# Patient Record
Sex: Male | Born: 1950 | Race: White | Hispanic: No | Smoking: Current every day smoker
Health system: Southern US, Community
[De-identification: ages and names within clinical notes are randomized; demographics above are authoritative.]

## PROBLEM LIST (undated history)

## (undated) DIAGNOSIS — R413 Other amnesia: Secondary | ICD-10-CM

## (undated) DIAGNOSIS — F1010 Alcohol abuse, uncomplicated: Secondary | ICD-10-CM

## (undated) DIAGNOSIS — E785 Hyperlipidemia, unspecified: Secondary | ICD-10-CM

## (undated) DIAGNOSIS — I10 Essential (primary) hypertension: Secondary | ICD-10-CM

## (undated) DIAGNOSIS — G479 Sleep disorder, unspecified: Secondary | ICD-10-CM

## (undated) DIAGNOSIS — K219 Gastro-esophageal reflux disease without esophagitis: Secondary | ICD-10-CM

## (undated) DIAGNOSIS — R011 Cardiac murmur, unspecified: Secondary | ICD-10-CM

## (undated) DIAGNOSIS — R5383 Other fatigue: Secondary | ICD-10-CM

## (undated) DIAGNOSIS — M25562 Pain in left knee: Secondary | ICD-10-CM

## (undated) HISTORY — DX: Pain in left knee: M25.562

## (undated) HISTORY — PX: COLONOSCOPY: SHX174

## (undated) HISTORY — DX: Other amnesia: R41.3

## (undated) HISTORY — PX: KNEE SURGERY: SHX244

## (undated) HISTORY — PX: BACK SURGERY: SHX140

## (undated) HISTORY — DX: Other fatigue: R53.83

## (undated) HISTORY — DX: Sleep disorder, unspecified: G47.9

## (undated) HISTORY — DX: Cardiac murmur, unspecified: R01.1

## (undated) HISTORY — DX: Alcohol abuse, uncomplicated: F10.10

## (undated) HISTORY — DX: Hyperlipidemia, unspecified: E78.5

## (undated) HISTORY — DX: Essential (primary) hypertension: I10

## (undated) HISTORY — DX: Gastro-esophageal reflux disease without esophagitis: K21.9

---

## 2005-04-07 ENCOUNTER — Ambulatory Visit: Payer: Self-pay | Admitting: Gastroenterology

## 2016-09-01 ENCOUNTER — Telehealth: Payer: Self-pay | Admitting: *Deleted

## 2016-09-01 DIAGNOSIS — Z87891 Personal history of nicotine dependence: Secondary | ICD-10-CM

## 2016-09-01 NOTE — Telephone Encounter (Signed)
Received referral for initial lung cancer screening scan. Contacted patient and obtained smoking history,(current, 127.5 pack year) as well as answering questions related to screening process. Patient denies signs of lung cancer such as weight loss or hemoptysis. Patient denies comorbidity that would prevent curative treatment if lung cancer were found. Patient is scheduled for shared decision making visit and CT scan on 09/07/16.

## 2016-09-07 ENCOUNTER — Inpatient Hospital Stay: Payer: Medicare Other | Attending: Oncology | Admitting: Oncology

## 2016-09-07 ENCOUNTER — Ambulatory Visit
Admission: RE | Admit: 2016-09-07 | Discharge: 2016-09-07 | Disposition: A | Discharge status: Home / Self Care | Payer: Medicare Other | Source: Ambulatory Visit | Attending: Oncology | Admitting: Oncology

## 2016-09-07 ENCOUNTER — Encounter: Payer: Self-pay | Admitting: Oncology

## 2016-09-07 DIAGNOSIS — I2510 Atherosclerotic heart disease of native coronary artery without angina pectoris: Secondary | ICD-10-CM | POA: Insufficient documentation

## 2016-09-07 DIAGNOSIS — F17210 Nicotine dependence, cigarettes, uncomplicated: Secondary | ICD-10-CM

## 2016-09-07 DIAGNOSIS — Z87891 Personal history of nicotine dependence: Secondary | ICD-10-CM

## 2016-09-07 DIAGNOSIS — Z122 Encounter for screening for malignant neoplasm of respiratory organs: Secondary | ICD-10-CM

## 2016-09-07 DIAGNOSIS — J439 Emphysema, unspecified: Secondary | ICD-10-CM | POA: Insufficient documentation

## 2016-09-07 DIAGNOSIS — I700 Atherosclerosis of aorta: Secondary | ICD-10-CM | POA: Diagnosis not present

## 2016-09-07 DIAGNOSIS — Z720 Tobacco use: Secondary | ICD-10-CM | POA: Insufficient documentation

## 2016-09-07 NOTE — Progress Notes (Signed)
In accordance with CMS guidelines, patient has met eligibility criteria including age, absence of signs or symptoms of lung cancer.  Social History  Substance Use Topics  . Smoking status: Current Every Day Smoker    Packs/day: 2.50    Years: 51.00  . Smokeless tobacco: Not on file  . Alcohol use Not on file     A shared decision-making session was conducted prior to the performance of CT scan. This includes one or more decision aids, includes benefits and harms of screening, follow-up diagnostic testing, over-diagnosis, false positive rate, and total radiation exposure.  Counseling on the importance of adherence to annual lung cancer LDCT screening, impact of co-morbidities, and ability or willingness to undergo diagnosis and treatment is imperative for compliance of the program.  Counseling on the importance of continued smoking cessation for former smokers; the importance of smoking cessation for current smokers, and information about tobacco cessation interventions have been given to patient including [place] and [facility] quit [place] programs.  Written order for lung cancer screening with LDCT has been given to the patient and any and all questions have been answered to the best of my abilities.   Yearly follow up will be coordinated by Burgess Estelle, Thoracic Navigator.  Faythe Casa, NP 09/07/2016 2:34 PM

## 2016-09-10 ENCOUNTER — Encounter: Payer: Self-pay | Admitting: *Deleted

## 2016-12-15 ENCOUNTER — Encounter: Payer: Self-pay | Admitting: Internal Medicine

## 2016-12-15 ENCOUNTER — Ambulatory Visit (INDEPENDENT_AMBULATORY_CARE_PROVIDER_SITE_OTHER): Payer: Medicare Other | Admitting: Internal Medicine

## 2016-12-15 VITALS — BP 112/72 | HR 71 | Ht 68.0 in | Wt 171.8 lb

## 2016-12-15 DIAGNOSIS — I10 Essential (primary) hypertension: Secondary | ICD-10-CM

## 2016-12-15 DIAGNOSIS — F1010 Alcohol abuse, uncomplicated: Secondary | ICD-10-CM

## 2016-12-15 DIAGNOSIS — Z720 Tobacco use: Secondary | ICD-10-CM | POA: Diagnosis not present

## 2016-12-15 DIAGNOSIS — R0609 Other forms of dyspnea: Secondary | ICD-10-CM | POA: Diagnosis not present

## 2016-12-15 DIAGNOSIS — R0789 Other chest pain: Secondary | ICD-10-CM

## 2016-12-15 DIAGNOSIS — R160 Hepatomegaly, not elsewhere classified: Secondary | ICD-10-CM | POA: Diagnosis not present

## 2016-12-15 MED ORDER — NITROGLYCERIN 0.4 MG SL SUBL: 0.4000 mg | SUBLINGUAL_TABLET | SUBLINGUAL | 3 refills | Status: AC | PRN

## 2016-12-15 NOTE — Progress Notes (Signed)
New Outpatient Visit Date: 12/15/2016  Referring Provider: Herminio Commons, [place] [id number]  Chief Complaint: Chest pain  HPI:  Mr. Eckert is a 66 y.o. male who is being seen today for the evaluation of chest pain at the request of Dr. Gwynneth Aliment. He has a history of hypertension, hyperlipidemia, obesity, prediabetes, GERD, and alcohol/tobacco abuse. Mr. Reep reports substernal chest pain for more than a year, though it seems to be getting more frequent. He describes an "electricity" feeling under his chest that lasts up to 30 seconds. It is 9/10 in intensity and seems to occur sporadically. Sometimes he will have multiple episodes a day and other times he will go a week or two without any symptoms. There are no exacerbating or alleviating factors; chest pain is not clearly related to exertion. He notes associated dyspnea.  Mr. Knauer has chronic exertional dyspnea that has been gradually becoming worse over the last several years. He notes that even mild activity such as raking or digging a hole are becoming difficult for him, requiring him to stop during the activity to catch his breath. He notes occasional PND but denies orthopnea and leg edema. He denies palpitations other than feeling his heart beating fast when he exerts himself or "gets mad." He denies lightheadedness and syncope.  Mr. Raczkowski denies a history of cardiovascular disease, though he has undergone at least one stress test in the remote past. He believes the stress test was normal. Recent EKG by Dr. Gwynneth Aliment was abnormal with septal Q waves.  --------------------------------------------------------------------------------------------------  Cardiovascular History & Procedures: Cardiovascular Problems:  Atypical chest pain  Dyspnea on exertion  Risk Factors:  Hypertension, hyperlipidemia, male gender, age greater than 57, and tobacco use  Cath/PCI:  None  CV Surgery:  None  EP  Procedures and Devices:  None  Non-Invasive Evaluation(s):  Not available: Patient reports negative stress test greater than 10 years ago  --------------------------------------------------------------------------------------------------  Past Medical History:  Diagnosis Date  . Alcohol abuse   . Fatigue   . GERD (gastroesophageal reflux disease)   . Heart murmur   . Hyperlipidemia   . Hypertension   . Knee pain, left   . Memory impairment   . Sleeping difficulties     Past Surgical History:  Procedure Laterality Date  . BACK SURGERY    . COLONOSCOPY    . KNEE SURGERY Bilateral     Current Meds  Medication Sig  . aspirin 81 MG chewable tablet Chew 81 mg as needed by mouth.  Marland Kitchen atorvastatin (LIPITOR) 80 MG tablet Take 80 mg daily by mouth.  . hydrochlorothiazide (HYDRODIURIL) 25 MG tablet Take 25 mg daily by mouth.  Marland Kitchen lisinopril (PRINIVIL,ZESTRIL) 20 MG tablet Take 20 mg daily by mouth.  . meloxicam (MOBIC) 15 MG tablet Take 15 mg daily by mouth.  Marland Kitchen omeprazole (PRILOSEC) 20 MG capsule Take 20 mg daily by mouth.  . traZODone (DESYREL) 100 MG tablet Take 100 mg at bedtime by mouth.    Allergies: Patient has no allergy information on record.  Social History   Socioeconomic History  . Marital status: Single    Spouse name: Not on file  . Number of children: Not on file  . Years of education: Not on file  . Highest education level: Not on file  Social Needs  . Financial resource strain: Not on file  . Food insecurity - worry: Not on file  . Food insecurity - inability: Not on file  .  Transportation needs - medical: Not on file  . Transportation needs - non-medical: Not on file  Occupational History  . Not on file  Tobacco Use  . Smoking status: Current Every Day Smoker    Packs/day: 2.00    Years: 51.00    Pack years: 102.00  . Smokeless tobacco: Never Used  Substance and Sexual Activity  . Alcohol use: Yes    Alcohol/week: 42.0 oz    Types: 70 Cans of  beer per week    Frequency: Never  . Drug use: No  . Sexual activity: Not on file  Other Topics Concern  . Not on file  Social History Narrative  . Not on file    Family History  Problem Relation Age of Onset  . Other Mother        "blood clot in the heart"  . Diabetes Father   . Cancer Sister   . Cancer Brother     Review of Systems: Mr. Shirah notes leg cramps, predominantly at night. No leg pain with activity. Otherwise, a 12-system review of systems was performed and was negative except as noted in the HPI.  --------------------------------------------------------------------------------------------------  Physical Exam: BP 112/72 (BP Location: Right Arm, Patient Position: Sitting, Cuff Size: Normal)   Pulse 71   Ht 5\' 8"  (1.727 m)   Wt 171 lb 12 oz (77.9 kg)   BMI 26.11 kg/m   General:  Overweight man, seated comfortably in the exam room. He is accompanied by his wife. HEENT: No conjunctival pallor or scleral icterus. Moist mucous membranes. OP clear. Neck: Supple without lymphadenopathy, thyromegaly, JVD, or HJR. No carotid bruit. Lungs: Normal work of breathing. Mildly diminished breath sounds throughout without wheezes or crackles. Heart: Regular rate and rhythm without murmurs, rubs, or gallops. Non-displaced PMI. Abd: Bowel sounds present. Soft with mild right upper quadrant tenderness. Liver edge is palpable below the costal margin, suggestive of hepatomegaly. Ext: No lower extremity edema. Radial, PT, and DP pulses are 2+ bilaterally Skin: Warm and dry without rash. Neuro: CNIII-XII intact. Strength and fine-touch sensation intact in upper and lower extremities bilaterally. Psych: Normal mood and affect.  EKG:  Normal sinus rhythm without significant abnormalities. R-wave progression has improved from outside tracing on 10/20/16.  No results found for: WBC, HGB, HCT, MCV, PLT  No results found for: NA, K, CL, CO2, BUN, CREATININE, GLUCOSE, ALT  No results  found for: CHOL, HDL, LDLCALC, LDLDIRECT, TRIG, CHOLHDL  --------------------------------------------------------------------------------------------------  ASSESSMENT AND PLAN: Atypical chest pain Mr. Knisley has a long history of chest pain with atypical features in that it is brief, electrical in nature, and nonexertional. However, he also has long-standing exertional dyspnea that has progressed the last several years. He has multiple cardiac risk factors. His EKG is normal today. Previously noted septal Q waves are no longer present, which suggests that some may have been due to lead placement. Mr. Sweet is concerned about his ability to adequately exercise, given his dyspnea on exertion. We have therefore agreed to obtain a pharmacologic myocardial perfusion stress test. Based on the results of this, we will determine the need for cardiac catheterization and/or echocardiogram. In the meantime, I have provided Mr. Gales with a prescription for sublingual nitroglycerin to be used as needed for chest pain. He should continue with aspirin, atorvastatin, and resolved.  Dyspnea on exertion This is likely multifactorial, and driven predominantly by Mr. Dunlow's long history of smoking and possible underlying lung disease. I think it is reasonable to exclude significant CAD,  as outlined above. We will discuss utility of echocardiogram after completion of the stress test. Of note, Mr. Dettore appears euvolemic on exam today without murmurs.  Hepatomegaly Incidental finding on exam today. Given Mr. Carras is long history of heavy alcohol use, further imaging should be considered. I will defer this to Dr. Gwynneth Aliment.  Tobacco and alcohol abuse Mr. Novelo is a heavy drinker and smoker. I encouraged him to quit.  Follow-up: To be determined based on results of stress test.  Nelva Bush, MD 12/17/2016 8:35 AM

## 2016-12-15 NOTE — Patient Instructions (Addendum)
Medication Instructions:  Your physician recommends that you continue on your current medications as directed. Please refer to the Current Medication list given to you today. NITROGLYCERIN 0.4 mg tablet under the tongue every 5 minutes, for maximum of 3 times, AS NEEDED FOR CHEST PAIN. If chest pain not relieved after 3 doses, then call 911 and to go the emergency room.    Labwork: NONE  Testing/Procedures: Your physician has requested that you have a lexiscan myoview. For further information please visit [email or web address]. Please follow instruction sheet, as given.  [place]  Your caregiver has ordered a Stress Test with nuclear imaging. The purpose of this test is to evaluate the blood supply to your heart muscle. This procedure is referred to as a "Non-Invasive Stress Test." This is because other than having an IV started in your vein, nothing is inserted or "invades" your body. Cardiac stress tests are done to find areas of poor blood flow to the heart by determining the extent of coronary artery disease (CAD). Some patients exercise on a treadmill, which naturally increases the blood flow to your heart, while others who are  unable to walk on a treadmill due to physical limitations have a pharmacologic/chemical stress agent called Lexiscan . This medicine will mimic walking on a treadmill by temporarily increasing your coronary blood flow.   Please note: these test may take anywhere between 2-4 hours to complete  PLEASE REPORT TO [facility] AT THE FIRST DESK WILL DIRECT YOU WHERE TO GO  Date of Procedure:______11/20/18_________  Arrival Time for Procedure:_______08:45 AM____________  Instructions regarding medication:    _X_:  Hold other medications as follows:________HYDROCHLOROTHIAZIDE_____________    PLEASE NOTIFY THE OFFICE AT LEAST 24 HOURS IN ADVANCE IF YOU ARE UNABLE TO KEEP YOUR APPOINTMENT.  [phone] AND  PLEASE NOTIFY NUCLEAR  MEDICINE AT [facility] AT LEAST 24 HOURS IN ADVANCE IF YOU ARE UNABLE TO KEEP YOUR APPOINTMENT. [phone]  How to prepare for your Myoview test:  1. Do not eat or drink after midnight 2. No caffeine for 24 hours prior to test 3. No smoking 24 hours prior to test. 4. Your medication may be taken with water.  If your doctor stopped a medication because of this test, do not take that medication. 5. Ladies, please do not wear dresses.  Skirts or pants are appropriate. Please wear a short sleeve shirt. 6. No perfume, cologne or lotion. 7. Wear comfortable walking shoes. No heels!        Follow-Up: Your physician recommends that you schedule a follow-up appointment in: PENDING RESULTS OF STRESS TEST.    Cardiac Nuclear Scan A cardiac nuclear scan is a test that measures blood flow to the heart when a person is resting and when he or she is exercising. The test looks for problems such as:  Not enough blood reaching a portion of the heart.  The heart muscle not working normally.  You may need this test if:  You have heart disease.  You have had abnormal lab results.  You have had heart surgery or angioplasty.  You have chest pain.  You have shortness of breath.  In this test, a radioactive dye (tracer) is injected into your bloodstream. After the tracer has traveled to your heart, an imaging device is used to measure how much of the tracer is absorbed by or distributed to various areas of your heart. This procedure is usually done at a hospital and takes 2-4 hours. Tell a health care  provider about:  Any allergies you have.  All medicines you are taking, including vitamins, herbs, eye drops, creams, and over-the-counter medicines.  Any problems you or family members have had with the use of anesthetic medicines.  Any blood disorders you have.  Any surgeries you have had.  Any medical conditions you have.  Whether you are pregnant or may be pregnant. What are the  risks? Generally, this is a safe procedure. However, problems may occur, including:  Serious chest pain and heart attack. This is only a risk if the stress portion of the test is done.  Rapid heartbeat.  Sensation of warmth in your chest. This usually passes quickly.  What happens before the procedure?  Ask your health care provider about changing or stopping your regular medicines. This is especially important if you are taking diabetes medicines or blood thinners.  Remove your jewelry on the day of the procedure. What happens during the procedure?  An IV tube will be inserted into one of your veins.  Your health care provider will inject a small amount of radioactive tracer through the tube.  You will wait for 20-40 minutes while the tracer travels through your bloodstream.  Your heart activity will be monitored with an electrocardiogram (ECG).  You will lie down on an exam table.  Images of your heart will be taken for about 15-20 minutes.  You may be asked to exercise on a treadmill or stationary bike. While you exercise, your heart's activity will be monitored with an ECG, and your blood pressure will be checked. If you are unable to exercise, you may be given a medicine to increase blood flow to parts of your heart.  When blood flow to your heart has peaked, a tracer will again be injected through the IV tube.  After 20-40 minutes, you will get back on the exam table and have more images taken of your heart.  When the procedure is over, your IV tube will be removed. The procedure may vary among health care providers and hospitals. Depending on the type of tracer used, scans may need to be repeated 3-4 hours later. What happens after the procedure?  Unless your health care provider tells you otherwise, you may return to your normal schedule, including diet, activities, and medicines.  Unless your health care provider tells you otherwise, you may increase your fluid  intake. This will help flush the contrast dye from your body. Drink enough fluid to keep your urine clear or pale yellow.  It is up to you to get your test results. Ask your health care provider, or the department that is doing the test, when your results will be ready. Summary  A cardiac nuclear scan measures the blood flow to the heart when a person is resting and when he or she is exercising.  You may need this test if you are at risk for heart disease.  Tell your health care provider if you are pregnant.  Unless your health care provider tells you otherwise, increase your fluid intake. This will help flush the contrast dye from your body. Drink enough fluid to keep your urine clear or pale yellow. This information is not intended to replace advice given to you by your health care provider. Make sure you discuss any questions you have with your health care provider. Document Released: 02/13/2004 Document Revised: 01/21/2016 Document Reviewed: 12/27/2012 Elsevier Interactive Patient Education  2017 [employer].

## 2016-12-17 DIAGNOSIS — R160 Hepatomegaly, not elsewhere classified: Secondary | ICD-10-CM | POA: Insufficient documentation

## 2016-12-17 DIAGNOSIS — R0609 Other forms of dyspnea: Secondary | ICD-10-CM

## 2016-12-17 DIAGNOSIS — I10 Essential (primary) hypertension: Secondary | ICD-10-CM | POA: Insufficient documentation

## 2016-12-17 DIAGNOSIS — R0789 Other chest pain: Secondary | ICD-10-CM | POA: Insufficient documentation

## 2016-12-17 DIAGNOSIS — F1010 Alcohol abuse, uncomplicated: Secondary | ICD-10-CM | POA: Insufficient documentation

## 2016-12-21 ENCOUNTER — Ambulatory Visit
Admission: RE | Admit: 2016-12-21 | Discharge: 2016-12-21 | Disposition: A | Discharge status: Home / Self Care | Payer: Medicare Other | Source: Ambulatory Visit | Attending: Internal Medicine | Admitting: Internal Medicine

## 2016-12-21 DIAGNOSIS — R0789 Other chest pain: Secondary | ICD-10-CM | POA: Diagnosis not present

## 2016-12-21 LAB — NM MYOCAR MULTI W/SPECT W/WALL MOTION / EF
CHL CUP RESTING HR STRESS: 65 {beats}/min
CSEPPHR: 130 {beats}/min
LV sys vol: 29 mL
LVDIAVOL: 72 mL (ref 62–150)
Percent HR: 84 %
TID: 1.13

## 2016-12-21 MED ORDER — REGADENOSON 0.4 MG/5ML IV SOLN
0.4000 mg | Freq: Once | INTRAVENOUS | Status: AC
  Administered 2016-12-21: 0.4 mg via INTRAVENOUS

## 2016-12-21 MED ORDER — TECHNETIUM TC 99M TETROFOSMIN IV KIT
32.9200 | PACK | Freq: Once | INTRAVENOUS | Status: AC | PRN
  Administered 2016-12-21: 32.92 via INTRAVENOUS

## 2016-12-21 MED ORDER — TECHNETIUM TC 99M TETROFOSMIN IV KIT
10.0000 | PACK | Freq: Once | INTRAVENOUS | Status: AC | PRN
  Administered 2016-12-21: 13.92 via INTRAVENOUS

## 2016-12-22 ENCOUNTER — Telehealth: Payer: Self-pay | Admitting: *Deleted

## 2016-12-22 DIAGNOSIS — R0602 Shortness of breath: Secondary | ICD-10-CM

## 2016-12-22 NOTE — Telephone Encounter (Signed)
-----   Message from Nelva Bush, MD sent at 12/21/2016  8:55 PM EST ----- Please let Howard Lucas know that his stress test does not show any significant abnormalities. Given considerable shortness of breath, I recommend that Howard Lucas be seen by pulmonary and also have a transthoracic echocardiogram at his convenience. We will determine need for further cardiac follow-up after completion of the echo.

## 2016-12-22 NOTE — Telephone Encounter (Signed)
Thank you for the update.  I will defer to Mr. Cicero the timing of pulmonary referral and echocardiogram.  Nelva Bush, MD [place] Pager: [phone]

## 2016-12-22 NOTE — Telephone Encounter (Signed)
Results called to pt. Pt verbalized understanding of results and plan of care. However, patient does not want to schedule referral to pulmonology or echo until after the first of the year. He understood what echo and referral was for but still would like to wait. He verbalized understanding to call us when he is ready. Orders and referral placed in Epic with note to be done after the first of the year. Will route to Dr End to make him aware.

## 2017-02-03 ENCOUNTER — Encounter: Payer: Self-pay | Admitting: Internal Medicine

## 2017-06-29 ENCOUNTER — Other Ambulatory Visit: Payer: Self-pay | Admitting: Family Medicine

## 2017-06-29 DIAGNOSIS — F17200 Nicotine dependence, unspecified, uncomplicated: Secondary | ICD-10-CM

## 2017-06-29 DIAGNOSIS — Z136 Encounter for screening for cardiovascular disorders: Secondary | ICD-10-CM

## 2017-07-26 ENCOUNTER — Encounter (INDEPENDENT_AMBULATORY_CARE_PROVIDER_SITE_OTHER): Payer: Medicare Other | Admitting: Vascular Surgery

## 2017-09-01 ENCOUNTER — Telehealth: Payer: Self-pay | Admitting: Nurse Practitioner

## 2017-09-02 ENCOUNTER — Telehealth: Payer: Self-pay | Admitting: *Deleted

## 2017-09-02 DIAGNOSIS — Z122 Encounter for screening for malignant neoplasm of respiratory organs: Secondary | ICD-10-CM

## 2017-09-02 DIAGNOSIS — Z87891 Personal history of nicotine dependence: Secondary | ICD-10-CM

## 2017-09-02 NOTE — Telephone Encounter (Signed)
Notified patient that annual lung cancer screening low dose CT scan is due currently or will be in near future. Confirmed that patient is within the age range of 55-77, and asymptomatic, (no signs or symptoms of lung cancer). Patient denies illness that would prevent curative treatment for lung cancer if found. Verified smoking history, (current, 129.5 pack year). The shared decision making visit was done 09/07/16. Patient is agreeable for CT scan being scheduled.

## 2017-09-07 ENCOUNTER — Ambulatory Visit
Admission: RE | Admit: 2017-09-07 | Discharge: 2017-09-07 | Disposition: A | Discharge status: Home / Self Care | Payer: Medicare Other | Source: Ambulatory Visit | Attending: Nurse Practitioner | Admitting: Nurse Practitioner

## 2017-09-07 DIAGNOSIS — Z87891 Personal history of nicotine dependence: Secondary | ICD-10-CM | POA: Insufficient documentation

## 2017-09-07 DIAGNOSIS — J439 Emphysema, unspecified: Secondary | ICD-10-CM | POA: Insufficient documentation

## 2017-09-07 DIAGNOSIS — I700 Atherosclerosis of aorta: Secondary | ICD-10-CM | POA: Diagnosis not present

## 2017-09-07 DIAGNOSIS — Z122 Encounter for screening for malignant neoplasm of respiratory organs: Secondary | ICD-10-CM | POA: Diagnosis present

## 2017-09-09 ENCOUNTER — Encounter: Payer: Self-pay | Admitting: *Deleted

## 2017-11-22 ENCOUNTER — Encounter (INDEPENDENT_AMBULATORY_CARE_PROVIDER_SITE_OTHER): Payer: Medicare Other | Admitting: Vascular Surgery

## 2018-03-17 ENCOUNTER — Other Ambulatory Visit: Payer: Self-pay | Admitting: Family Medicine

## 2018-03-17 DIAGNOSIS — Z13820 Encounter for screening for osteoporosis: Secondary | ICD-10-CM

## 2018-03-24 ENCOUNTER — Other Ambulatory Visit (INDEPENDENT_AMBULATORY_CARE_PROVIDER_SITE_OTHER): Payer: Self-pay | Admitting: Vascular Surgery

## 2018-03-24 DIAGNOSIS — I739 Peripheral vascular disease, unspecified: Secondary | ICD-10-CM

## 2018-03-27 ENCOUNTER — Ambulatory Visit (INDEPENDENT_AMBULATORY_CARE_PROVIDER_SITE_OTHER): Payer: Medicare Other | Admitting: Vascular Surgery

## 2018-03-27 ENCOUNTER — Other Ambulatory Visit: Payer: Self-pay

## 2018-03-27 ENCOUNTER — Ambulatory Visit (INDEPENDENT_AMBULATORY_CARE_PROVIDER_SITE_OTHER): Payer: Medicare Other

## 2018-03-27 ENCOUNTER — Encounter (INDEPENDENT_AMBULATORY_CARE_PROVIDER_SITE_OTHER): Payer: Self-pay | Admitting: Vascular Surgery

## 2018-03-27 VITALS — BP 134/75 | HR 74 | Resp 16 | Ht 68.0 in | Wt 179.6 lb

## 2018-03-27 DIAGNOSIS — I10 Essential (primary) hypertension: Secondary | ICD-10-CM | POA: Diagnosis not present

## 2018-03-27 DIAGNOSIS — M47817 Spondylosis without myelopathy or radiculopathy, lumbosacral region: Secondary | ICD-10-CM | POA: Insufficient documentation

## 2018-03-27 DIAGNOSIS — M79606 Pain in leg, unspecified: Secondary | ICD-10-CM | POA: Insufficient documentation

## 2018-03-27 DIAGNOSIS — M79604 Pain in right leg: Secondary | ICD-10-CM | POA: Diagnosis not present

## 2018-03-27 DIAGNOSIS — I739 Peripheral vascular disease, unspecified: Secondary | ICD-10-CM

## 2018-03-27 DIAGNOSIS — I8312 Varicose veins of left lower extremity with inflammation: Secondary | ICD-10-CM

## 2018-03-27 DIAGNOSIS — I872 Venous insufficiency (chronic) (peripheral): Secondary | ICD-10-CM | POA: Diagnosis not present

## 2018-03-27 DIAGNOSIS — M79605 Pain in left leg: Secondary | ICD-10-CM | POA: Diagnosis not present

## 2018-03-27 DIAGNOSIS — I8311 Varicose veins of right lower extremity with inflammation: Secondary | ICD-10-CM | POA: Insufficient documentation

## 2018-03-27 NOTE — Progress Notes (Signed)
MRN : [id number]  Howard Lucas is a 68 y.o. (Oct 13, 1950) male who presents with chief complaint of leg pain.  History of Present Illness:    The patient denies changes in claudication symptoms or new rest pain symptoms.  No new ulcers or wounds of the foot.  The patient's blood pressure has been stable and relatively well controlled. The patient denies amaurosis fugax or recent TIA symptoms. There are no recent neurological changes noted. The patient denies history of DVT, PE or superficial thrombophlebitis. The patient denies recent episodes of angina or shortness of breath.   No outpatient medications have been marked as taking for the 03/27/18 encounter (Appointment) with Delana Meyer, Dolores Lory, MD.    Past Medical History:  Diagnosis Date  . Alcohol abuse   . Fatigue   . GERD (gastroesophageal reflux disease)   . Heart murmur   . Hyperlipidemia   . Hypertension   . Knee pain, left   . Memory impairment   . Sleeping difficulties     Past Surgical History:  Procedure Laterality Date  . BACK SURGERY    . COLONOSCOPY    . KNEE SURGERY Bilateral     Social History Social History   Tobacco Use  . Smoking status: Current Every Day Smoker    Packs/day: 2.00    Years: 51.00    Pack years: 102.00  . Smokeless tobacco: Never Used  Substance Use Topics  . Alcohol use: Yes    Alcohol/week: 70.0 standard drinks    Types: 70 Cans of beer per week    Frequency: Never  . Drug use: No    Family History Family History  Problem Relation Age of Onset  . Other Mother        "blood clot in the heart"  . Diabetes Father   . Cancer Sister   . Cancer Brother   No family history of bleeding/clotting disorders, porphyria or autoimmune disease   Not on File   REVIEW OF SYSTEMS (Negative unless checked)  Constitutional: [] Weight loss  [] Fever  [] Chills Cardiac: [] Chest pain   [] Chest pressure   [] Palpitations   [] Shortness of breath when laying flat   [] Shortness of breath  with exertion. Vascular:  [x] Pain in legs with walking   [x] Pain in legs at rest  [] History of DVT   [] Phlebitis   [] Swelling in legs   [] Varicose veins   [] Non-healing ulcers Pulmonary:   [] Uses home oxygen   [] Productive cough   [] Hemoptysis   [] Wheeze  [] COPD   [] Asthma Neurologic:  [] Dizziness   [] Seizures   [] History of stroke   [] History of TIA  [] Aphasia   [] Vissual changes   [] Weakness or numbness in arm   [x] Weakness or numbness in leg Musculoskeletal:   [] Joint swelling   [x] Joint pain   [x] Low back pain Hematologic:  [] Easy bruising  [] Easy bleeding   [] Hypercoagulable state   [] Anemic Gastrointestinal:  [] Diarrhea   [] Vomiting  [] Gastroesophageal reflux/heartburn   [] Difficulty swallowing. Genitourinary:  [] Chronic kidney disease   [] Difficult urination  [] Frequent urination   [] Blood in urine Skin:  [] Rashes   [] Ulcers  Psychological:  [] History of anxiety   []  History of major depression.  Physical Examination  There were no vitals filed for this visit. There is no height or weight on file to calculate BMI. Gen: WD/WN, NAD Head: [place]/AT, No temporalis wasting.  Ear/Nose/Throat: Hearing grossly intact, nares w/o erythema or drainage, poor dentition Eyes: PER, EOMI, sclera nonicteric.  Neck: Supple,  no masses.  No bruit or JVD.  Pulmonary:  Good air movement, clear to auscultation bilaterally, no use of accessory muscles.  Cardiac: RRR, normal S1, S2, no Murmurs. Vascular:   Vessel Right Left  Radial Palpable Palpable  PT Palpable Palpable  DP Palpable Palpable   Gastrointestinal: soft, non-distended. No guarding/no peritoneal signs.  Musculoskeletal: M/S 5/5 throughout.  No deformity or atrophy.  Neurologic: CN 2-12 intact. Pain and light touch intact in extremities.  Symmetrical.  Speech is fluent. Motor exam as listed above. Psychiatric: Judgment intact, Mood & affect appropriate for pt's clinical situation. Dermatologic: No rashes or ulcers noted.  No changes consistent  with cellulitis. Lymph : No Cervical lymphadenopathy, no lichenification or skin changes of chronic lymphedema.  CBC No results found for: WBC, HGB, HCT, MCV, PLT  BMET No results found for: NA, K, CL, CO2, GLUCOSE, BUN, CREATININE, CALCIUM, GFRNONAA, GFRAA CrCl cannot be calculated (No successful lab value found.).  COAG No results found for: INR, PROTIME  Radiology No results found.   Assessment/Plan 1. Pain in both lower extremities  Recommend:  The patient has evidence of atherosclerosis of the lower extremities with claudication.  The patient does not voice lifestyle limiting changes at this point in time.  Noninvasive studies do not suggest clinically significant change.  No invasive studies, angiography or surgery at this time The patient should continue walking and begin a more formal exercise program.  The patient should continue antiplatelet therapy and aggressive treatment of the lipid abnormalities  No changes in the patient's medications at this time  The patient should continue wearing graduated compression socks 10-15 mmHg strength to control the mild edema.   - VAS [place] ABI WITH/WO TBI; Future  2. DJD (degenerative joint disease), lumbosacral Recommend:  I do not find evidence of Vascular pathology that would explain the patient's symptoms  The patient has atypical pain symptoms for vascular disease  Noninvasive studies including venous ultrasound of the legs do not identify vascular problems  The patient should continue walking and begin a more formal exercise program. The patient should continue his antiplatelet therapy and aggressive treatment of the lipid abnormalities. The patient should begin wearing graduated compression socks 15-20 mmHg strength to control her mild edema.  Further work-up of her lower extremity pain is deferred to the primary service     3. Chronic venous insufficiency No surgery or intervention at this point in time.    I  have reviewed my discussion with the patient regarding venous insufficiency and secondary lymph edema and why it  causes symptoms. I have discussed with the patient the chronic skin changes that accompany these problems and the long term sequela such as ulceration and infection.  Patient will continue wearing graduated compression stockings class 1 (20-30 mmHg) on a daily basis a prescription was given to the patient to keep this updated. The patient will  put the stockings on first thing in the morning and removing them in the evening. The patient is instructed specifically not to sleep in the stockings.  In addition, behavioral modification including elevation during the day will be continued.  Diet and salt restriction was also discussed.  Previous duplex ultrasound of the lower extremities shows normal deep venous system, superficial reflux was not present.   Following the review of the ultrasound the patient will follow up in 12 months to reassess the degree of swelling and the control that graduated compression is offering.   The patient can be assessed for a  Lymph Pump at that time.  However, at this time the patient states they are satisfied with the control compression and elevation is yielding.    4. Varicose veins of both lower extremities with inflammation No surgery or intervention at this point in time.    I have reviewed my discussion with the patient regarding venous insufficiency and secondary lymph edema and why it  causes symptoms. I have discussed with the patient the chronic skin changes that accompany these problems and the long term sequela such as ulceration and infection.  Patient will continue wearing graduated compression stockings class 1 (20-30 mmHg) on a daily basis a prescription was given to the patient to keep this updated. The patient will  put the stockings on first thing in the morning and removing them in the evening. The patient is instructed specifically not to sleep  in the stockings.  In addition, behavioral modification including elevation during the day will be continued.  Diet and salt restriction was also discussed.  Previous duplex ultrasound of the lower extremities shows normal deep venous system, superficial reflux was not present.   Following the review of the ultrasound the patient will follow up in 12 months to reassess the degree of swelling and the control that graduated compression is offering.   The patient can be assessed for a Lymph Pump at that time.  However, at this time the patient states they are satisfied with the control compression and elevation is yielding.    5. Essential hypertension Continue antihypertensive medications as already ordered, these medications have been reviewed and there are no changes at this time.    Hortencia Pilar, MD  03/27/2018 8:36 AM

## 2018-04-03 ENCOUNTER — Other Ambulatory Visit: Payer: Medicare Other

## 2018-09-08 ENCOUNTER — Telehealth: Payer: Self-pay | Admitting: *Deleted

## 2018-09-08 NOTE — Telephone Encounter (Signed)
Patient has been notified that lung cancer screening CT scan is due currently or will be in near future. Confirmed that patient is within the appropriate age range, and asymptomatic, (no signs or symptoms of lung cancer). Patient denies illness that would prevent curative treatment for lung cancer if found. Verified smoking history (Current Smoker 2ppd). Patient is agreeable for CT scan being scheduled and has no preference to day or time.

## 2018-09-12 ENCOUNTER — Other Ambulatory Visit: Payer: Self-pay | Admitting: *Deleted

## 2018-09-12 DIAGNOSIS — Z87891 Personal history of nicotine dependence: Secondary | ICD-10-CM

## 2018-09-12 DIAGNOSIS — Z122 Encounter for screening for malignant neoplasm of respiratory organs: Secondary | ICD-10-CM

## 2018-09-14 ENCOUNTER — Other Ambulatory Visit: Payer: Self-pay

## 2018-09-14 ENCOUNTER — Ambulatory Visit
Admission: RE | Admit: 2018-09-14 | Discharge: 2018-09-14 | Disposition: A | Discharge status: Home / Self Care | Payer: Medicare Other | Source: Ambulatory Visit | Attending: Oncology | Admitting: Oncology

## 2018-09-14 DIAGNOSIS — Z122 Encounter for screening for malignant neoplasm of respiratory organs: Secondary | ICD-10-CM | POA: Diagnosis not present

## 2018-09-14 DIAGNOSIS — Z87891 Personal history of nicotine dependence: Secondary | ICD-10-CM | POA: Insufficient documentation

## 2018-09-15 ENCOUNTER — Encounter: Payer: Self-pay | Admitting: *Deleted

## 2019-03-29 ENCOUNTER — Encounter (INDEPENDENT_AMBULATORY_CARE_PROVIDER_SITE_OTHER): Payer: Medicare Other

## 2019-03-29 ENCOUNTER — Ambulatory Visit (INDEPENDENT_AMBULATORY_CARE_PROVIDER_SITE_OTHER): Payer: Medicare Other | Admitting: Vascular Surgery

## 2019-08-21 ENCOUNTER — Other Ambulatory Visit: Payer: Self-pay

## 2019-08-21 ENCOUNTER — Telehealth: Payer: Self-pay

## 2019-08-21 DIAGNOSIS — Z1211 Encounter for screening for malignant neoplasm of colon: Secondary | ICD-10-CM

## 2019-08-21 NOTE — Telephone Encounter (Signed)
Gastroenterology Pre-Procedure Review  Request Date: 09/14/19 Requesting Physician: Dr. Allen Norris  PATIENT REVIEW QUESTIONS: The patient responded to the following health history questions as indicated:    1. Are you having any GI issues? no 2. Do you have a personal history of Polyps? no 3. Do you have a family history of Colon Cancer or Polyps? no 4. Diabetes Mellitus? no 5. Joint replacements in the past 12 months?no 6. Major health problems in the past 3 months?no 7. Any artificial heart valves, MVP, or defibrillator?no    MEDICATIONS & ALLERGIES:    Patient reports the following regarding taking any anticoagulation/antiplatelet therapy:   Plavix, Coumadin, Eliquis, Xarelto, Lovenox, Pradaxa, Brilinta, or Effient? no Aspirin? yes (81 mg daily)  Patient confirms/reports the following medications:  Current Outpatient Medications  Medication Sig Dispense Refill   albuterol (PROVENTIL HFA;VENTOLIN HFA) 108 (90 Base) MCG/ACT inhaler Inhale into the lungs every 6 (six) hours as needed for wheezing or shortness of breath.     aspirin 81 MG chewable tablet Chew 81 mg as needed by mouth.     atorvastatin (LIPITOR) 80 MG tablet Take 80 mg daily by mouth.     B Complex Vitamins (B COMPLEX-B12 PO) Take by mouth daily.     budesonide-formoterol (SYMBICORT) 160-4.5 MCG/ACT inhaler Inhale 2 puffs into the lungs 2 (two) times daily.     hydrochlorothiazide (HYDRODIURIL) 25 MG tablet Take 25 mg daily by mouth.     lisinopril (PRINIVIL,ZESTRIL) 20 MG tablet Take 20 mg daily by mouth.     meloxicam (MOBIC) 15 MG tablet Take 15 mg daily by mouth.     nitroGLYCERIN (NITROSTAT) 0.4 MG SL tablet Place 1 tablet (0.4 mg total) every 5 (five) minutes as needed under the tongue for chest pain. 35 tablet 3   omeprazole (PRILOSEC) 20 MG capsule Take 20 mg daily by mouth.     PARoxetine (PAXIL) 20 MG tablet Take 20 mg by mouth daily.     sildenafil (VIAGRA) 100 MG tablet Take 100 mg by mouth daily as  needed for erectile dysfunction.     tadalafil (CIALIS) 10 MG tablet Take 10 mg by mouth daily as needed for erectile dysfunction.     traZODone (DESYREL) 100 MG tablet Take 100 mg at bedtime by mouth.     No current facility-administered medications for this visit.    Patient confirms/reports the following allergies:  No Known Allergies  No orders of the defined types were placed in this encounter.   AUTHORIZATION INFORMATION Primary Insurance: 1D#: Group #:  Secondary Insurance: 1D#: Group #:  SCHEDULE INFORMATION: Date: 09/14/19 Time: Location:[facility]

## 2019-08-22 ENCOUNTER — Telehealth: Payer: Medicare HMO

## 2019-08-22 ENCOUNTER — Other Ambulatory Visit: Payer: Self-pay

## 2019-08-22 DIAGNOSIS — Z1211 Encounter for screening for malignant neoplasm of colon: Secondary | ICD-10-CM

## 2019-09-06 ENCOUNTER — Telehealth: Payer: Self-pay

## 2019-09-06 NOTE — Telephone Encounter (Signed)
Patient notified that lung screening imaging is due currently or in the near future. Patient's preference is for 09/20/19 @ 9:30.  Patient is a current smoker, smoking 1 pack per day the past year.

## 2019-09-07 ENCOUNTER — Other Ambulatory Visit: Payer: Self-pay | Admitting: *Deleted

## 2019-09-07 DIAGNOSIS — Z122 Encounter for screening for malignant neoplasm of respiratory organs: Secondary | ICD-10-CM

## 2019-09-07 DIAGNOSIS — Z87891 Personal history of nicotine dependence: Secondary | ICD-10-CM

## 2019-09-08 ENCOUNTER — Other Ambulatory Visit: Payer: Self-pay | Admitting: Family Medicine

## 2019-09-08 DIAGNOSIS — R61 Generalized hyperhidrosis: Secondary | ICD-10-CM

## 2019-09-10 ENCOUNTER — Encounter: Payer: Self-pay | Admitting: Gastroenterology

## 2019-09-10 ENCOUNTER — Other Ambulatory Visit: Payer: Self-pay

## 2019-09-10 NOTE — Anesthesia Preprocedure Evaluation (Addendum)
Anesthesia Evaluation  Patient identified by MRN, date of birth, ID band Patient awake    Reviewed: Allergy & Precautions, NPO status , Patient's Chart, lab work & pertinent test results  History of Anesthesia Complications Negative for: history of anesthetic complications  Airway Mallampati: II  TM Distance: >3 FB Neck ROM: Full    Dental  (+) Poor Dentition   Pulmonary neg sleep apnea, neg COPD, Current SmokerPatient did not abstain from smoking.,    breath sounds clear to auscultation- rhonchi (-) wheezing      Cardiovascular hypertension, Pt. on medications + Peripheral Vascular Disease  (-) CAD, (-) Past MI, (-) Cardiac Stents and (-) CABG  Rhythm:Regular Rate:Normal - Systolic murmurs and - Diastolic murmurs  HLD   Neuro/Psych neg Seizures PSYCHIATRIC DISORDERS (EtOH abuse) negative neurological ROS     GI/Hepatic GERD  ,(+)     substance abuse  alcohol use,   Endo/Other  negative endocrine ROSneg diabetes  Renal/GU negative Renal ROS     Musculoskeletal  (+) Arthritis ,   Abdominal (+) - obese,   Peds  Hematology negative hematology ROS (+)   Anesthesia Other Findings Past Medical History: No date: Alcohol abuse No date: Fatigue No date: GERD (gastroesophageal reflux disease) No date: Heart murmur No date: Hyperlipidemia No date: Hypertension No date: Knee pain, left No date: Memory impairment No date: Sleeping difficulties   Reproductive/Obstetrics                            Anesthesia Physical Anesthesia Plan  ASA: III  Anesthesia Plan: General   Post-op Pain Management:    Induction: Intravenous  PONV Risk Score and Plan: 0 and Propofol infusion, TIVA and Treatment may vary due to age or medical condition  Airway Management Planned: Natural Airway and Nasal Cannula  Additional Equipment:   Intra-op Plan:   Post-operative Plan:   Informed Consent: I have  reviewed the patients History and Physical, chart, labs and discussed the procedure including the risks, benefits and alternatives for the proposed anesthesia with the patient or authorized representative who has indicated his/her understanding and acceptance.       Plan Discussed with: CRNA and Anesthesiologist  Anesthesia Plan Comments:        Anesthesia Quick Evaluation

## 2019-09-12 ENCOUNTER — Other Ambulatory Visit
Admission: RE | Admit: 2019-09-12 | Discharge: 2019-09-12 | Disposition: A | Discharge status: Home / Self Care | Payer: Medicare HMO | Source: Ambulatory Visit | Attending: Gastroenterology | Admitting: Gastroenterology

## 2019-09-12 ENCOUNTER — Other Ambulatory Visit: Payer: Self-pay

## 2019-09-12 DIAGNOSIS — Z01812 Encounter for preprocedural laboratory examination: Secondary | ICD-10-CM | POA: Insufficient documentation

## 2019-09-12 DIAGNOSIS — Z20822 Contact with and (suspected) exposure to covid-19: Secondary | ICD-10-CM | POA: Insufficient documentation

## 2019-09-13 LAB — SARS CORONAVIRUS 2 (TAT 6-24 HRS): SARS Coronavirus 2: NEGATIVE

## 2019-09-13 NOTE — Discharge Instructions (Signed)
General Anesthesia, Adult, Care After This sheet gives you information about how to care for yourself after your procedure. Your health care provider may also give you more specific instructions. If you have problems or questions, contact your health care provider. What can I expect after the procedure? After the procedure, the following side effects are common:  Pain or discomfort at the IV site.  Nausea.  Vomiting.  Sore throat.  Trouble concentrating.  Feeling cold or chills.  Weak or tired.  Sleepiness and fatigue.  Soreness and body aches. These side effects can affect parts of the body that were not involved in surgery. Follow these instructions at home:  For at least 24 hours after the procedure:  Have a responsible adult stay with you. It is important to have someone help care for you until you are awake and alert.  Rest as needed.  Do not: ? Participate in activities in which you could fall or become injured. ? Drive. ? Use heavy machinery. ? Drink alcohol. ? Take sleeping pills or medicines that cause drowsiness. ? Make important decisions or sign legal documents. ? Take care of children on your own. Eating and drinking  Follow any instructions from your health care provider about eating or drinking restrictions.  When you feel hungry, start by eating small amounts of foods that are soft and easy to digest (bland), such as toast. Gradually return to your regular diet.  Drink enough fluid to keep your urine pale yellow.  If you vomit, rehydrate by drinking water, juice, or clear broth. General instructions  If you have sleep apnea, surgery and certain medicines can increase your risk for breathing problems. Follow instructions from your health care provider about wearing your sleep device: ? Anytime you are sleeping, including during daytime naps. ? While taking prescription pain medicines, sleeping medicines, or medicines that make you drowsy.  Return to  your normal activities as told by your health care provider. Ask your health care provider what activities are safe for you.  Take over-the-counter and prescription medicines only as told by your health care provider.  If you smoke, do not smoke without supervision.  Keep all follow-up visits as told by your health care provider. This is important. Contact a health care provider if:  You have nausea or vomiting that does not get better with medicine.  You cannot eat or drink without vomiting.  You have pain that does not get better with medicine.  You are unable to pass urine.  You develop a skin rash.  You have a fever.  You have redness around your IV site that gets worse. Get help right away if:  You have difficulty breathing.  You have chest pain.  You have blood in your urine or stool, or you vomit blood. Summary  After the procedure, it is common to have a sore throat or nausea. It is also common to feel tired.  Have a responsible adult stay with you for the first 24 hours after general anesthesia. It is important to have someone help care for you until you are awake and alert.  When you feel hungry, start by eating small amounts of foods that are soft and easy to digest (bland), such as toast. Gradually return to your regular diet.  Drink enough fluid to keep your urine pale yellow.  Return to your normal activities as told by your health care provider. Ask your health care provider what activities are safe for you. This information is not   intended to replace advice given to you by your health care provider. Make sure you discuss any questions you have with your health care provider. Document Revised: 01/21/2017 Document Reviewed: 09/03/2016 Elsevier Patient Education  2020 [employer] Inc[employer]  

## 2019-09-14 ENCOUNTER — Encounter: Payer: Self-pay | Admitting: Anesthesiology

## 2019-09-14 ENCOUNTER — Encounter
Admission: RE | Disposition: A | Discharge status: Home / Self Care | Payer: Self-pay | Source: Home / Self Care | Attending: Gastroenterology

## 2019-09-14 ENCOUNTER — Other Ambulatory Visit: Payer: Self-pay

## 2019-09-14 ENCOUNTER — Ambulatory Visit
Admission: RE | Admit: 2019-09-14 | Discharge: 2019-09-14 | Disposition: A | Discharge status: Home / Self Care | Payer: Medicare HMO | Attending: Gastroenterology | Admitting: Gastroenterology

## 2019-09-14 DIAGNOSIS — D122 Benign neoplasm of ascending colon: Secondary | ICD-10-CM | POA: Insufficient documentation

## 2019-09-14 DIAGNOSIS — K219 Gastro-esophageal reflux disease without esophagitis: Secondary | ICD-10-CM | POA: Diagnosis not present

## 2019-09-14 DIAGNOSIS — I10 Essential (primary) hypertension: Secondary | ICD-10-CM | POA: Diagnosis not present

## 2019-09-14 DIAGNOSIS — Z791 Long term (current) use of non-steroidal anti-inflammatories (NSAID): Secondary | ICD-10-CM | POA: Insufficient documentation

## 2019-09-14 DIAGNOSIS — Z1211 Encounter for screening for malignant neoplasm of colon: Secondary | ICD-10-CM

## 2019-09-14 DIAGNOSIS — Z7982 Long term (current) use of aspirin: Secondary | ICD-10-CM | POA: Diagnosis not present

## 2019-09-14 DIAGNOSIS — Z7951 Long term (current) use of inhaled steroids: Secondary | ICD-10-CM | POA: Insufficient documentation

## 2019-09-14 DIAGNOSIS — E785 Hyperlipidemia, unspecified: Secondary | ICD-10-CM | POA: Insufficient documentation

## 2019-09-14 DIAGNOSIS — K635 Polyp of colon: Secondary | ICD-10-CM | POA: Diagnosis not present

## 2019-09-14 DIAGNOSIS — Z79899 Other long term (current) drug therapy: Secondary | ICD-10-CM | POA: Diagnosis not present

## 2019-09-14 DIAGNOSIS — F17210 Nicotine dependence, cigarettes, uncomplicated: Secondary | ICD-10-CM | POA: Diagnosis not present

## 2019-09-14 HISTORY — PX: COLONOSCOPY WITH PROPOFOL: SHX5780

## 2019-09-14 SURGERY — COLONOSCOPY WITH PROPOFOL
Anesthesia: General

## 2019-09-14 MED ORDER — SODIUM CHLORIDE 0.9 % IV SOLN
INTRAVENOUS | Status: DC
  Administered 2019-09-14: 20 mL/h via INTRAVENOUS

## 2019-09-14 MED ORDER — GLYCOPYRROLATE 0.2 MG/ML IJ SOLN
INTRAMUSCULAR | Status: DC | PRN
  Administered 2019-09-14: .2 mg via INTRAVENOUS

## 2019-09-14 MED ORDER — LIDOCAINE HCL (CARDIAC) PF 100 MG/5ML IV SOSY
PREFILLED_SYRINGE | INTRAVENOUS | Status: DC | PRN
  Administered 2019-09-14: 100 mg via INTRAVENOUS

## 2019-09-14 MED ORDER — PROPOFOL 10 MG/ML IV BOLUS
INTRAVENOUS | Status: DC | PRN
  Administered 2019-09-14: 60 mg via INTRAVENOUS
  Administered 2019-09-14 (×2): 10 mg via INTRAVENOUS

## 2019-09-14 MED ORDER — PROPOFOL 500 MG/50ML IV EMUL
INTRAVENOUS | Status: DC | PRN
  Administered 2019-09-14: 165 ug/kg/min via INTRAVENOUS

## 2019-09-14 NOTE — H&P (Signed)
Howard Lame, MD [address] [place], [place] [address] Phone: [phone] Fax : [phone]  Primary Care Physician:  Theotis Burrow, MD Primary Gastroenterologist:  Dr. Allen Norris  Pre-Procedure History & Physical: HPI:  Howard Lucas is a 69 y.o. male is here for a screening colonoscopy.   Past Medical History:  Diagnosis Date  . Alcohol abuse   . Fatigue   . GERD (gastroesophageal reflux disease)   . Heart murmur   . Hyperlipidemia   . Hypertension   . Knee pain, left   . Memory impairment   . Sleeping difficulties     Past Surgical History:  Procedure Laterality Date  . BACK SURGERY    . COLONOSCOPY    . KNEE SURGERY Bilateral     Prior to Admission medications   Medication Sig Start Date End Date Taking? Authorizing Provider  albuterol (PROVENTIL HFA;VENTOLIN HFA) 108 (90 Base) MCG/ACT inhaler Inhale into the lungs every 6 (six) hours as needed for wheezing or shortness of breath.   Yes [provider]  aspirin 81 MG chewable tablet Chew 81 mg as needed by mouth.   Yes [provider]  atorvastatin (LIPITOR) 80 MG tablet Take 80 mg daily by mouth.   Yes [provider]  B Complex Vitamins (B COMPLEX-B12 PO) Take by mouth daily.    Yes [provider]  budesonide-formoterol (SYMBICORT) 160-4.5 MCG/ACT inhaler Inhale 2 puffs into the lungs 2 (two) times daily.   Yes [provider]  hydrochlorothiazide (HYDRODIURIL) 25 MG tablet Take 25 mg daily by mouth.   Yes [provider]  lisinopril (PRINIVIL,ZESTRIL) 20 MG tablet Take 20 mg daily by mouth.   Yes [provider]  meloxicam (MOBIC) 15 MG tablet Take 15 mg daily by mouth.   Yes [provider]  omeprazole (PRILOSEC) 20 MG capsule Take 20 mg daily by mouth.   Yes [provider]  PARoxetine (PAXIL) 20 MG tablet Take 20 mg by mouth daily.    Yes [provider]  sildenafil (VIAGRA) 100 MG tablet Take 100  mg by mouth daily as needed for erectile dysfunction.    Yes [provider]  tadalafil (CIALIS) 10 MG tablet Take 10 mg by mouth daily as needed for erectile dysfunction.    Yes [provider]  traZODone (DESYREL) 100 MG tablet Take 100 mg at bedtime by mouth.   Yes [provider]  nitroGLYCERIN (NITROSTAT) 0.4 MG SL tablet Place 1 tablet (0.4 mg total) every 5 (five) minutes as needed under the tongue for chest pain. 12/15/16 03/15/17  Nelva Bush, MD    Allergies as of 08/21/2019  . (No Known Allergies)    Family History  Problem Relation Age of Onset  . Other Mother        "blood clot in the heart"  . Diabetes Father   . Cancer Sister   . Cancer Brother     Social History   Socioeconomic History  . Marital status: Single    Spouse name: Not on file  . Number of children: Not on file  . Years of education: Not on file  . Highest education level: Not on file  Occupational History  . Not on file  Tobacco Use  . Smoking status: Current Every Day Smoker    Packs/day: 2.00    Years: 51.00    Pack years: 102.00  . Smokeless tobacco: Never Used  Vaping Use  . Vaping Use: Never used  Substance and Sexual Activity  . Alcohol use: Yes    Alcohol/week: 70.0 standard drinks    Types: 70 Cans of beer per week    Comment: 8/9 - pt reports he has cut back to about 6 beers on Tues and Fri)  . Drug use: No  . Sexual activity: Not on file  Other Topics Concern  . Not on file  Social History Narrative  . Not on file   Social Determinants of Health   Financial Resource Strain:   . Difficulty of Paying Living Expenses:   Food Insecurity:   . Worried About Charity fundraiser in the Last Year:   . Arboriculturist in the Last Year:   Transportation Needs:   . Film/video editor (Medical):   Marland Kitchen Lack of Transportation (Non-Medical):   Physical Activity:   . Days of Exercise per Week:   . Minutes of Exercise per Session:   Stress:   .  Feeling of Stress :   Social Connections:   . Frequency of Communication with Friends and Family:   . Frequency of Social Gatherings with Friends and Family:   . Attends Religious Services:   . Active Member of Clubs or Organizations:   . Attends Archivist Meetings:   Marland Kitchen Marital Status:   Intimate Partner Violence:   . Fear of Current or Ex-Partner:   . Emotionally Abused:   Marland Kitchen Physically Abused:   . Sexually Abused:     Review of Systems: See HPI, otherwise negative ROS  Physical Exam: BP 118/71   Pulse 86   Temp (!) 96.8 F (36 C) (Temporal)   Resp 20   Ht 5\' 8"  (1.727 m)   Wt 79.4 kg   SpO2 98%   BMI 26.61 kg/m  General:   Alert,  pleasant and cooperative in NAD Head:  Normocephalic and atraumatic. Neck:  Supple; no masses or thyromegaly. Lungs:  Clear throughout to auscultation.    Heart:  Regular rate and rhythm. Abdomen:  Soft, nontender and nondistended. Normal bowel sounds, without guarding, and without rebound.   Neurologic:  Alert and  oriented x4;  grossly normal neurologically.  Impression/Plan: Howard Lucas is now here to undergo a screening colonoscopy.  Risks, benefits, and alternatives regarding colonoscopy have been reviewed with the patient.  Questions have been answered.  All parties agreeable.

## 2019-09-14 NOTE — Anesthesia Procedure Notes (Signed)
Procedure Name: General with mask airway Performed by: Fletcher-Harrison, Elayah Klooster, CRNA Pre-anesthesia Checklist: Patient identified, Emergency Drugs available, Suction available and Patient being monitored Patient Re-evaluated:Patient Re-evaluated prior to induction Oxygen Delivery Method: Simple face mask Induction Type: IV induction Placement Confirmation: positive ETCO2 and CO2 detector Dental Injury: Teeth and Oropharynx as per pre-operative assessment        

## 2019-09-14 NOTE — Op Note (Signed)
[facility] Gastroenterology Patient Name: Howard Lucas Procedure Date: 09/14/2019 11:03 AM MRN: [id number] Account #: [id number] Date of Birth: [birth year] Admit Type: Outpatient Age: 69 Room: [facility] ENDO ROOM 2 Gender: Male Note Status: Finalized Procedure:             Colonoscopy Indications:           Screening for colorectal malignant neoplasm Providers:             Lucilla Lame MD, MD Referring MD:          Elyse Jarvis Revelo (Referring MD) Medicines:             Propofol per Anesthesia Complications:         No immediate complications. Procedure:             Pre-Anesthesia Assessment:                        - Prior to the procedure, a History and Physical was                         performed, and patient medications and allergies were                         reviewed. The patient's tolerance of previous                         anesthesia was also reviewed. The risks and benefits                         of the procedure and the sedation options and risks                         were discussed with the patient. All questions were                         answered, and informed consent was obtained. Prior                         Anticoagulants: The patient has taken no previous                         anticoagulant or antiplatelet agents. ASA Grade                         Assessment: II - A patient with mild systemic disease.                         After reviewing the risks and benefits, the patient                         was deemed in satisfactory condition to undergo the                         procedure.                        After obtaining informed consent, the colonoscope was  passed under direct vision. Throughout the procedure,                         the patient's blood pressure, pulse, and oxygen                         saturations were monitored continuously. The                         Colonoscope was introduced through the  anus and                         advanced to the the cecum, identified by appendiceal                         orifice and ileocecal valve. The colonoscopy was                         performed without difficulty. The patient tolerated                         the procedure well. The quality of the bowel                         preparation was excellent. Findings:      The perianal and digital rectal examinations were normal.      Three sessile polyps were found in the rectum. The polyps were 3 to 4 mm       in size. These polyps were removed with a cold snare. Resection and       retrieval were complete.      A 5 mm polyp was found in the sigmoid colon. The polyp was sessile. The       polyp was removed with a cold snare. Resection and retrieval were       complete.      Two sessile polyps were found in the rectum. The polyps were 4 to 5 mm       in size. These polyps were removed with a cold snare. Resection and       retrieval were complete. Impression:            - Three 3 to 4 mm polyps in the rectum, removed with a                         cold snare. Resected and retrieved.                        - One 5 mm polyp in the sigmoid colon, removed with a                         cold snare. Resected and retrieved.                        - Two 4 to 5 mm polyps in the rectum, removed with a                         cold snare. Resected and retrieved. Recommendation:        - Discharge patient to home.                        -  Resume previous diet.                        - Continue present medications.                        - Await pathology results.                        - Repeat colonoscopy in 5 years if polyp adenoma and                         10 years if hyperplastic Procedure Code(s):     --- Professional ---                        [phone], Colonoscopy, flexible; with removal of                         tumor(s), polyp(s), or other lesion(s) by snare                          technique Diagnosis Code(s):     --- Professional ---                        Z12.11, Encounter for screening for malignant neoplasm                         of colon                        K62.1, Rectal polyp CPT copyright 2019 [employer] Association. All rights reserved. The codes documented in this report are preliminary and upon coder review may  be revised to meet current compliance requirements. Lucilla Lame MD, MD 09/14/2019 11:32:46 AM This report has been signed electronically. Number of Addenda: 0 Note Initiated On: 09/14/2019 11:03 AM Scope Withdrawal Time: 0 hours 11 minutes 52 seconds  Total Procedure Duration: 0 hours 18 minutes 6 seconds  Estimated Blood Loss:  Estimated blood loss: none.      [facility]

## 2019-09-14 NOTE — Transfer of Care (Signed)
Immediate Anesthesia Transfer of Care Note  Patient: Howard Lucas  Procedure(s) Performed: COLONOSCOPY WITH PROPOFOL (N/A )  Patient Location: Endoscopy Unit  Anesthesia Type:General  Level of Consciousness: drowsy and responds to stimulation  Airway & Oxygen Therapy: Patient Spontanous Breathing and Patient connected to face mask oxygen  Post-op Assessment: Report given to RN and Post -op Vital signs reviewed and stable  Post vital signs: Reviewed and stable  Last Vitals:  Vitals Value Taken Time  BP    Temp    Pulse 96 09/14/19 1135  Resp 13 09/14/19 1135  SpO2 99 % 09/14/19 1135  Vitals shown include unvalidated device data.  Last Pain:  Vitals:   09/14/19 1048  TempSrc: Temporal  PainSc: 0-No pain         Complications: No complications documented.

## 2019-09-14 NOTE — Anesthesia Postprocedure Evaluation (Signed)
Anesthesia Post Note  Patient: Howard Lucas  Procedure(s) Performed: COLONOSCOPY WITH PROPOFOL (N/A )  Patient location during evaluation: Endoscopy Anesthesia Type: General Level of consciousness: awake and alert and oriented Pain management: pain level controlled Vital Signs Assessment: post-procedure vital signs reviewed and stable Respiratory status: spontaneous breathing, nonlabored ventilation and respiratory function stable Cardiovascular status: blood pressure returned to baseline and stable Postop Assessment: no signs of nausea or vomiting Anesthetic complications: no   No complications documented.   Last Vitals:  Vitals:   09/14/19 1135 09/14/19 1145  BP:    Pulse:    Resp:  20  Temp: 36.8 C   SpO2:      Last Pain:  Vitals:   09/14/19 1205  TempSrc:   PainSc: 0-No pain                 Rusty Villella

## 2019-09-17 ENCOUNTER — Encounter: Payer: Self-pay | Admitting: Gastroenterology

## 2019-09-17 LAB — SURGICAL PATHOLOGY

## 2019-09-20 ENCOUNTER — Other Ambulatory Visit: Payer: Self-pay

## 2019-09-20 ENCOUNTER — Ambulatory Visit
Admission: RE | Admit: 2019-09-20 | Discharge: 2019-09-20 | Disposition: A | Discharge status: Home / Self Care | Payer: Medicare HMO | Source: Ambulatory Visit | Attending: Oncology | Admitting: Oncology

## 2019-09-20 DIAGNOSIS — Z87891 Personal history of nicotine dependence: Secondary | ICD-10-CM | POA: Insufficient documentation

## 2019-09-20 DIAGNOSIS — Z122 Encounter for screening for malignant neoplasm of respiratory organs: Secondary | ICD-10-CM | POA: Insufficient documentation

## 2019-09-25 ENCOUNTER — Encounter: Payer: Self-pay | Admitting: *Deleted

## 2019-09-28 ENCOUNTER — Ambulatory Visit: Admission: RE | Admit: 2019-09-28 | Payer: Medicare HMO | Source: Ambulatory Visit

## 2019-10-03 ENCOUNTER — Other Ambulatory Visit: Payer: Self-pay

## 2019-10-03 ENCOUNTER — Other Ambulatory Visit
Admission: RE | Admit: 2019-10-03 | Discharge: 2019-10-03 | Disposition: A | Discharge status: Home / Self Care | Payer: Medicare HMO | Source: Ambulatory Visit | Attending: Family Medicine | Admitting: Family Medicine

## 2019-10-03 ENCOUNTER — Ambulatory Visit
Admission: RE | Admit: 2019-10-03 | Discharge: 2019-10-03 | Disposition: A | Discharge status: Home / Self Care | Payer: Medicare HMO | Source: Ambulatory Visit | Attending: Family Medicine | Admitting: Family Medicine

## 2019-10-03 ENCOUNTER — Other Ambulatory Visit: Payer: Self-pay | Admitting: Family Medicine

## 2019-10-03 DIAGNOSIS — R1013 Epigastric pain: Secondary | ICD-10-CM | POA: Insufficient documentation

## 2019-10-03 DIAGNOSIS — R61 Generalized hyperhidrosis: Secondary | ICD-10-CM

## 2019-10-03 DIAGNOSIS — I700 Atherosclerosis of aorta: Secondary | ICD-10-CM | POA: Diagnosis not present

## 2019-10-03 LAB — CREATININE, SERUM
Creatinine, Ser: 1.74 mg/dL — ABNORMAL HIGH (ref 0.61–1.24)
GFR calc Af Amer: 45 mL/min — ABNORMAL LOW (ref >60)
GFR calc non Af Amer: 39 mL/min — ABNORMAL LOW (ref >60)

## 2019-10-03 MED ORDER — IOHEXOL 300 MG/ML  SOLN: 100.0000 mL | Freq: Once | INTRAMUSCULAR | Status: DC | PRN

## 2019-11-13 ENCOUNTER — Other Ambulatory Visit: Payer: Self-pay | Admitting: Family Medicine

## 2019-11-13 DIAGNOSIS — M25552 Pain in left hip: Secondary | ICD-10-CM

## 2020-05-03 ENCOUNTER — Ambulatory Visit
Admission: EM | Admit: 2020-05-03 | Discharge: 2020-05-03 | Disposition: A | Discharge status: Home / Self Care | Payer: Medicare HMO | Attending: Sports Medicine | Admitting: Sports Medicine

## 2020-05-03 ENCOUNTER — Encounter: Payer: Self-pay | Admitting: Emergency Medicine

## 2020-05-03 ENCOUNTER — Other Ambulatory Visit: Payer: Self-pay

## 2020-05-03 DIAGNOSIS — B029 Zoster without complications: Secondary | ICD-10-CM | POA: Diagnosis not present

## 2020-05-03 DIAGNOSIS — R21 Rash and other nonspecific skin eruption: Secondary | ICD-10-CM | POA: Diagnosis not present

## 2020-05-03 DIAGNOSIS — L299 Pruritus, unspecified: Secondary | ICD-10-CM | POA: Diagnosis not present

## 2020-05-03 MED ORDER — VALACYCLOVIR HCL 1 G PO TABS
1000.0000 mg | ORAL_TABLET | Freq: Three times a day (TID) | ORAL | 0 refills | Status: DC
Start: 2020-05-03 — End: 2021-05-15

## 2020-05-03 MED ORDER — PREDNISONE 10 MG (21) PO TBPK
ORAL_TABLET | Freq: Every day | ORAL | 0 refills | Status: DC
Start: 2020-05-03 — End: 2021-02-07

## 2020-05-03 NOTE — ED Triage Notes (Signed)
Patient c/o red tinging and burning rash on the left side that started 2 weeks ago.  Patient denies fevers.

## 2020-05-03 NOTE — Discharge Instructions (Addendum)
Your rash is consistent with herpes zoster which is this shingles reactivation of chickenpox. I prescribed a medication called Valtrex for this.  I have also given you some prednisone to help with calming down the inflammation. Please do not take any Motrin, Advil, Naprosyn, Aleve, or ibuprofen or aspirin products while taking the prednisone.  You can take Tylenol. I was going to give you some tramadol, however given some of the medications you are on there is a contraindication for that so I decided to give you the prednisone instead. Please do not scratch the rash. You can use over-the-counter topical medicine such as Benadryl or hydrocortisone 1% to help with the itch.  You can also use oral Benadryl at night to help with the itching assist with sleeping. I want you to contact your primary care provider on Monday and let them know so that they can follow along. As we discussed reasons to go to the ER and the red flag signs and symptoms that we discussed in detail.  Of note if it involves the eye then you need to call 911 and go to the ER.  I hope you get to feeling better, Dr. Drema Dallas

## 2020-05-03 NOTE — ED Provider Notes (Signed)
[facility]    CSN: [id number] Arrival date & time: 05/03/20  1037      History   Chief Complaint Chief Complaint  Patient presents with  . Rash    HPI Howard Lucas is a 70 y.o. male.   Patient is a pleasant 70 year old male who presents with his daughter for evaluation of a rash on the left side of the posterior aspect of his head and onto the ground.  He has had symptoms now for a couple of weeks.  He has a blistery rash.  There is no significant discharge.  A little bit of neck stiffness is noted.  He did have chickenpox as a child.  He has not received the shingles booster.  He is under a lot of stress lately.  No new exposures or new foods.  The rash burns along the dermatome.  He denies any vision changes.  No significant headaches diaphoresis or jaw pain.  No chest pain or shortness of breath.  No red flag signs or symptoms elicited on history.  He normally sees [facility] but could not get in today.  He works as a Administrator.  According to his daughter to the pain can be quite severe at times.  Presents today for initial evaluation.     Past Medical History:  Diagnosis Date  . Alcohol abuse   . Fatigue   . GERD (gastroesophageal reflux disease)   . Heart murmur   . Hyperlipidemia   . Hypertension   . Knee pain, left   . Memory impairment   . Sleeping difficulties     Patient Active Problem List   Diagnosis Date Noted  . Encounter for screening colonoscopy   . Polyp of ascending colon   . Leg pain 03/27/2018  . DJD (degenerative joint disease), lumbosacral 03/27/2018  . Chronic venous insufficiency 03/27/2018  . Varicose veins of both lower extremities with inflammation 03/27/2018  . Atypical chest pain 12/17/2016  . Dyspnea on exertion 12/17/2016  . Essential hypertension 12/17/2016  . Hepatomegaly 12/17/2016  . Alcohol abuse 12/17/2016  . Tobacco abuse 09/07/2016    Past Surgical History:  Procedure Laterality Date  .  BACK SURGERY    . COLONOSCOPY    . COLONOSCOPY WITH PROPOFOL N/A 09/14/2019   Procedure: COLONOSCOPY WITH PROPOFOL;  Surgeon: Lucilla Lame, MD;  Location: [facility] ENDOSCOPY;  Service: Endoscopy;  Laterality: N/A;  . KNEE SURGERY Bilateral        Home Medications    Prior to Admission medications   Medication Sig Start Date End Date Taking? Authorizing Provider  albuterol (PROVENTIL HFA;VENTOLIN HFA) 108 (90 Base) MCG/ACT inhaler Inhale into the lungs every 6 (six) hours as needed for wheezing or shortness of breath.   Yes [provider]  aspirin 81 MG chewable tablet Chew 81 mg as needed by mouth.   Yes [provider]  atorvastatin (LIPITOR) 80 MG tablet Take 80 mg daily by mouth.   Yes [provider]  B Complex Vitamins (B COMPLEX-B12 PO) Take by mouth daily.    Yes [provider]  budesonide-formoterol (SYMBICORT) 160-4.5 MCG/ACT inhaler Inhale 2 puffs into the lungs 2 (two) times daily.   Yes [provider]  hydrochlorothiazide (HYDRODIURIL) 25 MG tablet Take 25 mg daily by mouth.   Yes [provider]  lisinopril (PRINIVIL,ZESTRIL) 20 MG tablet Take 20 mg daily by mouth.   Yes [provider]  meloxicam (MOBIC) 15 MG tablet Take 15  mg daily by mouth.   Yes [provider]  omeprazole (PRILOSEC) 20 MG capsule Take 20 mg daily by mouth.   Yes [provider]  PARoxetine (PAXIL) 20 MG tablet Take 20 mg by mouth daily.    Yes [provider]  predniSONE (STERAPRED UNI-PAK 21 TAB) 10 MG (21) TBPK tablet Take by mouth daily. Take 6 tabs by mouth daily  for 2 days, then 5 tabs for 2 days, then 4 tabs for 2 days, then 3 tabs for 2 days, 2 tabs for 2 days, then 1 tab by mouth daily for 2 days 05/03/20  Yes Verda Cumins, MD  sildenafil (VIAGRA) 100 MG tablet Take 100 mg by mouth daily as needed for erectile dysfunction.    Yes [provider]  valACYclovir (VALTREX) 1000 MG tablet Take 1 tablet  (1,000 mg total) by mouth 3 (three) times daily. 05/03/20  Yes Verda Cumins, MD  nitroGLYCERIN (NITROSTAT) 0.4 MG SL tablet Place 1 tablet (0.4 mg total) every 5 (five) minutes as needed under the tongue for chest pain. 12/15/16 03/15/17  End, Harrell Gave, MD  tadalafil (CIALIS) 10 MG tablet Take 10 mg by mouth daily as needed for erectile dysfunction.     [provider]  traZODone (DESYREL) 100 MG tablet Take 100 mg at bedtime by mouth.    [provider]    Family History Family History  Problem Relation Age of Onset  . Other Mother        "blood clot in the heart"  . Diabetes Father   . Cancer Sister   . Cancer Brother     Social History Social History   Tobacco Use  . Smoking status: Current Every Day Smoker    Packs/day: 2.00    Years: 51.00    Pack years: 102.00  . Smokeless tobacco: Never Used  Vaping Use  . Vaping Use: Never used  Substance Use Topics  . Alcohol use: Yes    Alcohol/week: 70.0 standard drinks    Types: 70 Cans of beer per week    Comment: 8/9 - pt reports he has cut back to about 6 beers on Tues and Fri)  . Drug use: No     Allergies   Patient has no known allergies.   Review of Systems Review of Systems  Constitutional: Negative.  Negative for activity change, appetite change, chills, diaphoresis, fatigue and fever.  HENT: Negative.  Negative for congestion, sinus pain and sore throat.   Eyes: Negative for pain.  Respiratory: Negative.  Negative for cough, chest tightness, shortness of breath, wheezing and stridor.   Cardiovascular: Negative.  Negative for chest pain and palpitations.  Gastrointestinal: Negative.  Negative for abdominal pain.  Genitourinary: Negative.   Musculoskeletal: Negative.  Negative for myalgias.  Skin: Negative.  Negative for color change, pallor, rash and wound.  Neurological: Positive for numbness and headaches. Negative for dizziness, tremors, seizures, syncope, facial asymmetry, speech  difficulty, weakness and light-headedness.  All other systems reviewed and are negative.    Physical Exam Triage Vital Signs ED Triage Vitals  Enc Vitals Group     BP 05/03/20 1057 (!) 146/83     Pulse Rate 05/03/20 1057 79     Resp 05/03/20 1057 16     Temp 05/03/20 1057 97.8 F (36.6 C)     Temp Source 05/03/20 1057 Oral     SpO2 05/03/20 1057 96 %     Weight 05/03/20 1052 168 lb (76.2 kg)  Height 05/03/20 1052 5\' 8"  (1.727 m)     Head Circumference --      Peak Flow --      Pain Score 05/03/20 1050 7     Pain Loc --      Pain Edu? --      Excl. in [place]? --    No data found.  Updated Vital Signs BP (!) 146/83 (BP Location: Left Arm)   Pulse 79   Temp 97.8 F (36.6 C) (Oral)   Resp 16   Ht 5\' 8"  (1.727 m)   Wt 76.2 kg   SpO2 96%   BMI 25.54 kg/m   Visual Acuity Right Eye Distance:   Left Eye Distance:   Bilateral Distance:    Right Eye Near:   Left Eye Near:    Bilateral Near:     Physical Exam Vitals and nursing note reviewed.  Constitutional:      General: He is not in acute distress.    Appearance: Normal appearance. He is not ill-appearing, toxic-appearing or diaphoretic.  HENT:     Nose: No congestion or rhinorrhea.     Mouth/Throat:     Mouth: Mucous membranes are moist.     Pharynx: No oropharyngeal exudate or posterior oropharyngeal erythema.  Eyes:     General: No visual field deficit. Cardiovascular:     Rate and Rhythm: Normal rate and regular rhythm.     Pulses: Normal pulses.     Heart sounds: Normal heart sounds. No murmur heard. No friction rub. No gallop.   Pulmonary:     Effort: Pulmonary effort is normal. No respiratory distress.     Breath sounds: Normal breath sounds. No stridor. No wheezing, rhonchi or rales.  Musculoskeletal:     Cervical back: Normal range of motion and neck supple. No rigidity or tenderness.  Lymphadenopathy:     Cervical: No cervical adenopathy.  Skin:    General: Skin is warm.     Capillary Refill:  Capillary refill takes less than 2 seconds.     Coloration: Skin is not jaundiced.     Findings: Lesion and rash present. No abrasion, bruising, ecchymosis, erythema or signs of injury. Rash is crusting and vesicular.  Neurological:     General: No focal deficit present.     Mental Status: He is alert and oriented to person, place, and time.     GCS: GCS eye subscore is 4. GCS verbal subscore is 5. GCS motor subscore is 6.     Cranial Nerves: No cranial nerve deficit, dysarthria or facial asymmetry.     Comments: Vesicular rash on the top of the left posterior aspect of the head      UC Treatments / Results  Labs (all labs ordered are listed, but only abnormal results are displayed) Labs Reviewed - No data to display  EKG   Radiology No results found.  Procedures Procedures (including critical care time)  Medications Ordered in UC Medications - No data to display  Initial Impression / Assessment and Plan / UC Course  I have reviewed the triage vital signs and the nursing notes.  Pertinent labs & imaging results that were available during my care of the patient were reviewed by me and considered in my medical decision making (see chart for details).  Clinical impression: Rash on the left side of the crown into the posterior aspect of his head.  No meningeal signs.  Rashes consistent with herpes zoster.  Neurological exam is grossly nonfocal.  Treatment plan: 1.  The findings and treatment plan were discussed in detail with the patient and his daughter.  All parties were in agreement and voiced verbal understanding. 2.  We will go ahead and prescribe Valtrex 1000 mg 3 times daily for a week. 3.  I also sent him home with a prednisone taper.  I do not want him to take any other NSAIDs while taking this.  He can supplement with Tylenol. 4.  Advised him not to scratch the rash as I do not want him to make this any worse and get a superimposed bacterial infection. 5.  For the  pruritus I recommended over-the-counter Benadryl or hydrocortisone cream.  He can use oral Benadryl at night. 6.  Asked him to contact his primary care provider on Monday and let them know they were seen in the urgent care and when he can be seen as I would like them to follow along and see him in follow-up.  They voiced verbal understanding. 7.  Educational handouts provided. 8.  I went over the red flag signs and symptoms in detail.  If he was to have any issues he knows to call 911 or go to the ER. 9.  Follow-up here as needed.    Final Clinical Impressions(s) / UC Diagnoses   Final diagnoses:  Rash  Herpes zoster without complication  Pruritus     Discharge Instructions     Your rash is consistent with herpes zoster which is this shingles reactivation of chickenpox. I prescribed a medication called Valtrex for this.  I have also given you some prednisone to help with calming down the inflammation. Please do not take any Motrin, Advil, Naprosyn, Aleve, or ibuprofen or aspirin products while taking the prednisone.  You can take Tylenol. I was going to give you some tramadol, however given some of the medications you are on there is a contraindication for that so I decided to give you the prednisone instead. Please do not scratch the rash. You can use over-the-counter topical medicine such as Benadryl or hydrocortisone 1% to help with the itch.  You can also use oral Benadryl at night to help with the itching assist with sleeping. I want you to contact your primary care provider on Monday and let them know so that they can follow along. As we discussed reasons to go to the ER and the red flag signs and symptoms that we discussed in detail.  Of note if it involves the eye then you need to call 911 and go to the ER.  I hope you get to feeling better, Dr. Drema Dallas    ED Prescriptions    Medication Sig Dispense Auth. Provider   valACYclovir (VALTREX) 1000 MG tablet Take 1 tablet (1,000  mg total) by mouth 3 (three) times daily. 21 tablet Verda Cumins, MD   predniSONE (STERAPRED UNI-PAK 21 TAB) 10 MG (21) TBPK tablet Take by mouth daily. Take 6 tabs by mouth daily  for 2 days, then 5 tabs for 2 days, then 4 tabs for 2 days, then 3 tabs for 2 days, 2 tabs for 2 days, then 1 tab by mouth daily for 2 days 42 tablet Verda Cumins, MD     PDMP not reviewed this encounter.   Verda Cumins, MD 05/05/20 1807

## 2021-02-07 ENCOUNTER — Encounter: Payer: Self-pay | Admitting: Emergency Medicine

## 2021-02-07 ENCOUNTER — Ambulatory Visit (INDEPENDENT_AMBULATORY_CARE_PROVIDER_SITE_OTHER): Payer: Medicare HMO

## 2021-02-07 ENCOUNTER — Other Ambulatory Visit: Payer: Self-pay

## 2021-02-07 ENCOUNTER — Ambulatory Visit
Admission: EM | Admit: 2021-02-07 | Discharge: 2021-02-07 | Disposition: A | Discharge status: Home / Self Care | Payer: Medicare HMO

## 2021-02-07 DIAGNOSIS — R0602 Shortness of breath: Secondary | ICD-10-CM

## 2021-02-07 DIAGNOSIS — R051 Acute cough: Secondary | ICD-10-CM | POA: Diagnosis not present

## 2021-02-07 DIAGNOSIS — R059 Cough, unspecified: Secondary | ICD-10-CM

## 2021-02-07 DIAGNOSIS — J441 Chronic obstructive pulmonary disease with (acute) exacerbation: Secondary | ICD-10-CM

## 2021-02-07 MED ORDER — DOXYCYCLINE HYCLATE 100 MG PO CAPS: 100.0000 mg | ORAL_CAPSULE | Freq: Two times a day (BID) | ORAL | 0 refills | Status: AC

## 2021-02-07 MED ORDER — PREDNISONE 20 MG PO TABS: 40.0000 mg | ORAL_TABLET | Freq: Every day | ORAL | 0 refills | Status: AC

## 2021-02-07 NOTE — ED Provider Notes (Signed)
[facility]    CSN: [id number] Arrival date & time: 02/07/21  1444      History   Chief Complaint Chief Complaint  Patient presents with   Cough   Shortness of Breath    HPI Howard Lucas is a 71 y.o. male presenting for 3-week history of productive cough and chest congestion.  Patient also reports chest tightness.  He feels short of breath yesterday but feels relatively okay now and oxygen is 97%.  Patient is accompanied by his daughter.  States she almost called EMS last night when he got short of breath and his oxygen was 95%.  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  Patient reports that he has been using his albuterol inhaler but does not regularly use his Symbicort inhaler and says he did not know he was supposed to.  He has not been taking any cough medicine but has been using cough drops.  Denies any fevers.  No known COVID exposure and has tested negative at home.  Patient's ex-wife who he has been around has similar symptoms and also has COPD.  No other complaints.  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 Authorizing Provider  aspirin 81 MG chewable tablet Chew 81 mg as needed by mouth.   Yes [provider]  atorvastatin (LIPITOR) 80 MG tablet Take 80 mg daily by mouth.   Yes [provider]  budesonide-formoterol (SYMBICORT) 160-4.5 MCG/ACT inhaler Inhale 2 puffs into the lungs 2 (two) times daily.   Yes [provider]  doxycycline (VIBRAMYCIN) 100 MG capsule Take 1 capsule (100 mg total) by mouth 2 (two) times daily for 7 days. 02/07/21 02/14/21 Yes Danton Clap, PA-C  hydrochlorothiazide (HYDRODIURIL) 25 MG tablet Take 25 mg daily by mouth.   Yes [provider]  lisinopril (PRINIVIL,ZESTRIL) 20 MG tablet Take 20 mg daily by mouth.   Yes [provider]  meloxicam (MOBIC) 15 MG tablet Take 15 mg daily by mouth.   Yes [provider]  predniSONE (DELTASONE) 20 MG tablet Take 2 tablets (40 mg total) by mouth daily for 5 days. 02/07/21 02/12/21 Yes Danton Clap, PA-C  traZODone (DESYREL) 100 MG tablet Take 100 mg at bedtime by mouth.   Yes  [provider]  albuterol (PROVENTIL HFA;VENTOLIN HFA) 108 (90 Base) MCG/ACT inhaler Inhale into the lungs every 6 (six) hours as needed for wheezing or shortness of breath.    [provider]  B Complex Vitamins (B COMPLEX-B12 PO) Take by mouth daily.     [provider]  DULoxetine (CYMBALTA) 60 MG capsule Take 60 mg by mouth daily. 01/06/21   [provider]  gabapentin (NEURONTIN) 100 MG capsule Take 100 mg by mouth 3 (three) times daily. 01/12/21   [provider]  nitroGLYCERIN (NITROSTAT) 0.4 MG SL tablet Place 1 tablet (0.4 mg total) every 5 (five) minutes as needed under the tongue for chest pain. 12/15/16 03/15/17  End,  Harrell Gave, MD  omeprazole (PRILOSEC) 20 MG capsule Take 20 mg daily by mouth.    [provider]  PARoxetine (PAXIL) 20 MG tablet Take 20 mg by mouth daily.     [provider]  sildenafil (VIAGRA) 100 MG tablet Take 100 mg by mouth daily as needed for erectile dysfunction.     [provider]  tadalafil (CIALIS) 10 MG tablet Take 10 mg by mouth daily as needed for erectile dysfunction.     [provider]  valACYclovir (VALTREX) 1000 MG tablet Take 1 tablet (1,000 mg total) by mouth 3 (three) times daily. 05/03/20   Verda Cumins, MD    Family History Family History  Problem Relation Age of Onset   Other Mother        "blood clot in the heart"   Diabetes Father    Cancer Sister    Cancer Brother     Social History Social History   Tobacco Use   Smoking status: Every Day    Packs/day: 2.00    Years: 51.00    Pack years: 102.00    Types: Cigarettes   Smokeless tobacco: Never  Vaping Use   Vaping Use: Never used  Substance Use Topics   Alcohol use: Yes    Alcohol/week: 70.0 standard drinks    Types: 70 Cans of beer per week    Comment: 8/9 - pt reports he has cut back to about 6 beers on Tues and Fri)   Drug use: No     Allergies   Patient has no known allergies.   Review of Systems Review of Systems  Constitutional:  Positive for fatigue. Negative for fever.  HENT:  Positive for congestion. Negative for rhinorrhea, sinus pressure, sinus pain and sore throat.   Respiratory:  Positive for cough, shortness of breath and wheezing.   Cardiovascular:  Negative for chest pain.  Gastrointestinal:  Negative for abdominal pain, diarrhea, nausea and vomiting.  Musculoskeletal:  Negative for myalgias.  Neurological:  Negative for weakness, light-headedness and headaches.  Hematological:  Negative for adenopathy.    Physical Exam Triage Vital Signs ED Triage Vitals  Enc Vitals Group     BP 02/07/21 1531 140/65     Pulse Rate 02/07/21  1531 82     Resp 02/07/21 1531 16     Temp 02/07/21 1531 98.6 F (37 C)     Temp Source 02/07/21 1531 Oral     SpO2 02/07/21 1531 97 %     Weight 02/07/21 1528 180 lb (81.6 kg)     Height 02/07/21 1528 5\' 8"  (1.727 m)     Head Circumference --      Peak Flow --      Pain Score 02/07/21 1528 0     Pain Loc --  Pain Edu? --      Excl. in [place]? --    No data found.  Updated Vital Signs BP 140/65 (BP Location: Left Arm)    Pulse 82    Temp 98.6 F (37 C) (Oral)    Resp 16    Ht 5\' 8"  (1.727 m)    Wt 180 lb (81.6 kg)    SpO2 97%    BMI 27.37 kg/m      Physical Exam Vitals and nursing note reviewed.  Constitutional:      General: He is not in acute distress.    Appearance: Normal appearance. He is well-developed. He is ill-appearing.  HENT:     Head: Normocephalic and atraumatic.     Nose: Nose normal.     Mouth/Throat:     Mouth: Mucous membranes are moist.     Pharynx: Oropharynx is clear.  Eyes:     General: No scleral icterus.    Conjunctiva/sclera: Conjunctivae normal.  Cardiovascular:     Rate and Rhythm: Normal rate and regular rhythm.     Heart sounds: Normal heart sounds.  Pulmonary:     Effort: Pulmonary effort is normal. No respiratory distress.     Breath sounds: Wheezing (diffuse wheezing throughout all lung fields) present.  Musculoskeletal:     Cervical back: Neck supple.  Skin:    General: Skin is warm and dry.     Capillary Refill: Capillary refill takes less than 2 seconds.  Neurological:     General: No focal deficit present.     Mental Status: He is alert. Mental status is at baseline.     Motor: No weakness.     Gait: Gait normal.  Psychiatric:        Mood and Affect: Mood normal.        Behavior: Behavior normal.        Thought Content: Thought content normal.     UC Treatments / Results  Labs (all labs ordered are listed, but only abnormal results are displayed) Labs Reviewed - No data to display  EKG   Radiology DG Chest 2  View  Result Date: 02/07/2021 CLINICAL DATA:  Cough, chest congestion EXAM: CHEST - 2 VIEW COMPARISON:  CT chest, 09/20/2019 FINDINGS: The heart size and mediastinal contours are within normal limits. Pulmonary hyperinflation and emphysema. The visualized skeletal structures are unremarkable. IMPRESSION: Pulmonary hyperinflation and emphysema without acute abnormality of the lungs. Electronically Signed   By: Delanna Ahmadi M.D.   On: 02/07/2021 15:59    Procedures Procedures (including critical care time)  Medications Ordered in UC Medications - No data to display  Initial Impression / Assessment and Plan / UC Course  I have reviewed the triage vital signs and the nursing notes.  Pertinent labs & imaging results that were available during my care of the patient were reviewed by me and considered in my medical decision making (see chart for details).  71 year old male with history of COPD and current/longtime smoker presenting for 3-week history of productive cough and chest congestion.  Onset of feeling short of breath yesterday.  Has been using albuterol.  Does not regularly use Symbicort like he is supposed to.  Vitals all stable.  Patient ill-appearing but nontoxic.  He does have diffuse wheezing throughout all lung fields.  No respiratory distress.  Remainder of exam is normal.  Chest x-ray ordered to assess for possible pneumonia.  Reviewed results with patient which indicate no evidence of pneumonia.  It does show  emphysema.  Stressed the importance of him using his Symbicort inhaler every day like he is supposed to and albuterol for rescue.  He is having a COPD exacerbation at this time.  Encouraged him to increase rest and fluids.  Sent doxycycline and prednisone to pharmacy.  Encouraged him taking Mucinex.  Reviewed following up with PCP or returning if not improving over the next week.  ED precautions also reviewed.   Final Clinical Impressions(s) / UC Diagnoses   Final diagnoses:   COPD exacerbation (HCC)  Acute cough  Shortness of breath     Discharge Instructions      -X-ray does not show pneumonia. - You are having an exacerbation of her COPD so I sent corticosteroids and doxycycline. - You should be doing your Symbicort inhaler every day.  Albuterol as needed. - Increase rest and fluids and work on quitting smoking. - Go to ER if you have any low oxygen, increased breathing difficulty or fever.  Otherwise, follow-up with PCP or pulmonologist to have her manage her severe COPD.     ED Prescriptions     Medication Sig Dispense Auth. Provider   doxycycline (VIBRAMYCIN) 100 MG capsule Take 1 capsule (100 mg total) by mouth 2 (two) times daily for 7 days. 14 capsule Laurene Footman B, PA-C   predniSONE (DELTASONE) 20 MG tablet Take 2 tablets (40 mg total) by mouth daily for 5 days. 10 tablet Gretta Cool      PDMP not reviewed this encounter.   Danton Clap, PA-C 02/08/21 [phone]

## 2021-02-07 NOTE — Discharge Instructions (Addendum)
-  X-ray does not show pneumonia. - You are having an exacerbation of her COPD so I sent corticosteroids and doxycycline. - You should be doing your Symbicort inhaler every day.  Albuterol as needed. - Increase rest and fluids and work on quitting smoking. - Go to ER if you have any low oxygen, increased breathing difficulty or fever.  Otherwise, follow-up with PCP or pulmonologist to have her manage her severe COPD.

## 2021-02-07 NOTE — ED Triage Notes (Signed)
Patient c/o cough, chest congestion that started 3 weeks ago.  Patient reports SOB that started yesterday.  Patient is here with his daughter and states that she called EMS to get an O2 sat and was 95%

## 2021-02-23 ENCOUNTER — Telehealth: Payer: Self-pay | Admitting: Acute Care

## 2021-02-23 NOTE — Telephone Encounter (Signed)
Attempted to contact to schedule LDCT.  Cell phone said cannot complete call and home phone gave disconnect signal.

## 2021-04-10 IMAGING — CT CT ABD-PELV W/O CM
1 of 2 series · 15 of 32 positions shown, 19 images · non-contrast
Comparison: None.

CLINICAL DATA: Chronic night sweats.

EXAM:
CT ABDOMEN AND PELVIS WITHOUT CONTRAST
TECHNIQUE: Multidetector CT imaging of the abdomen and pelvis was performed
following the standard protocol without IV contrast.

[Series 2: axial st · axial · 0.77mm/px · z∈[-1087,-667]mm · 15 of 92 slices shown, 19 images]
[im 4/92  soft-tissue]
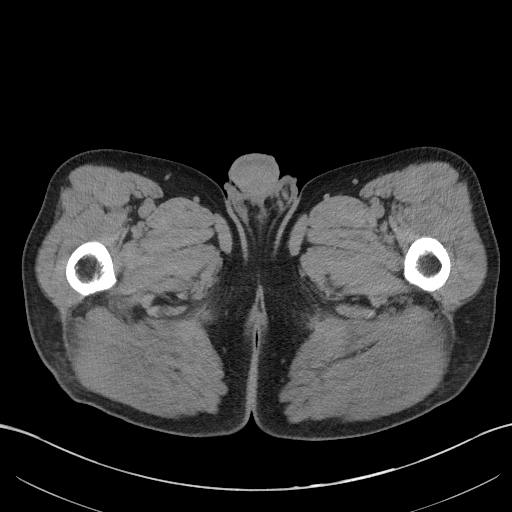
[im 4/92  bone]
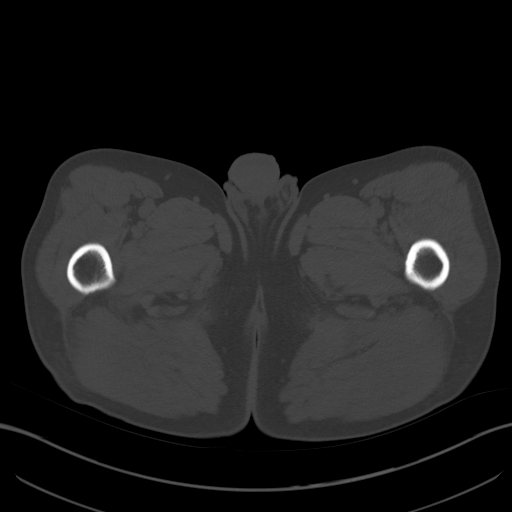
[im 11/92  soft-tissue]
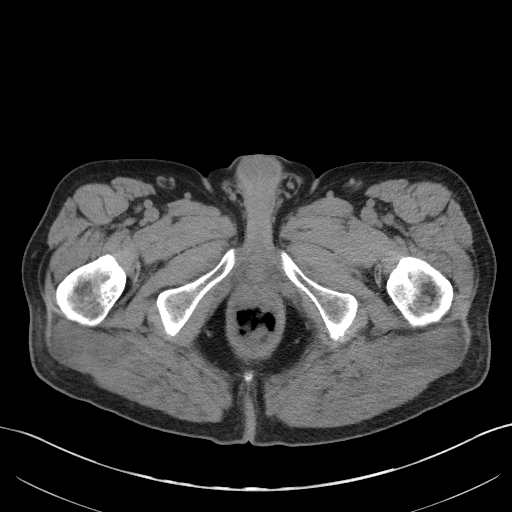
[im 19/92  soft-tissue]
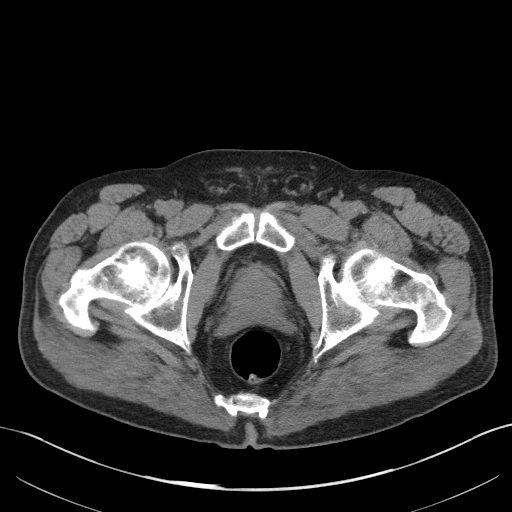
[im 26/92  soft-tissue]
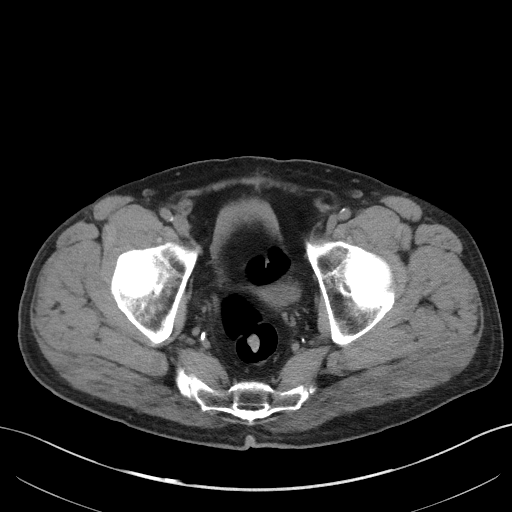
[im 33/92  soft-tissue]
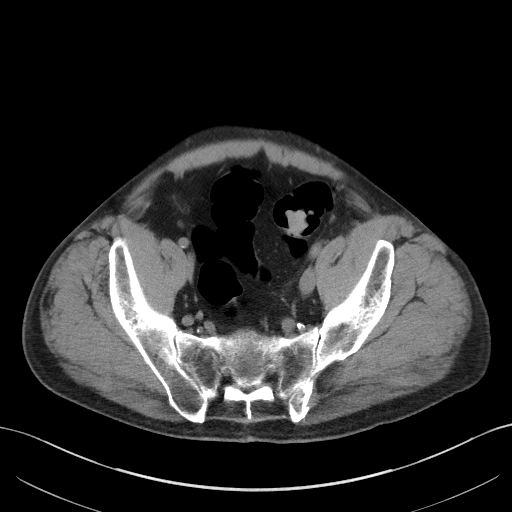
[im 41/92  soft-tissue]
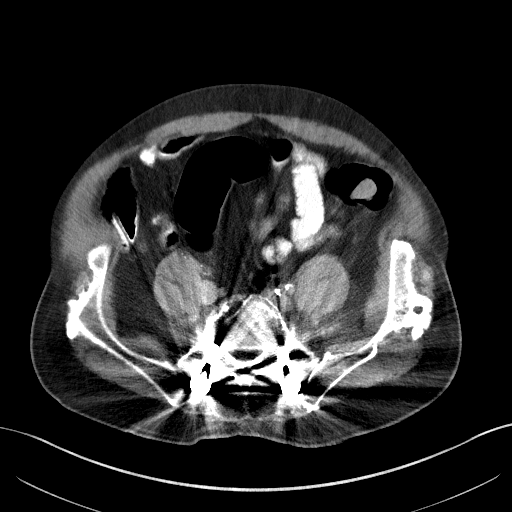
[im 48/92  soft-tissue]
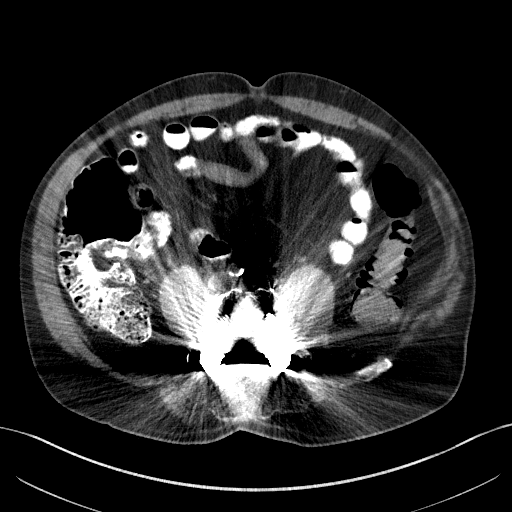
[im 51/92  soft-tissue]
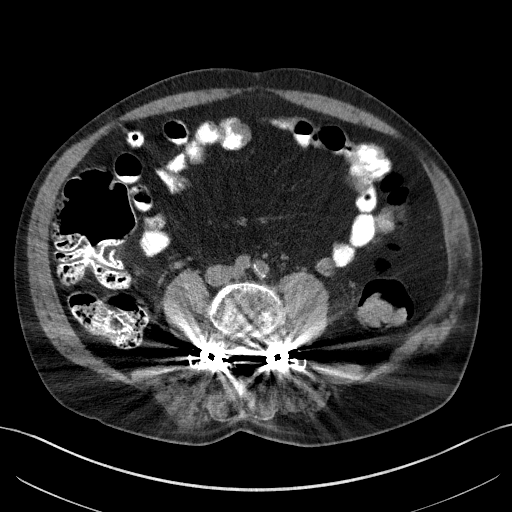
[im 59/92  soft-tissue]
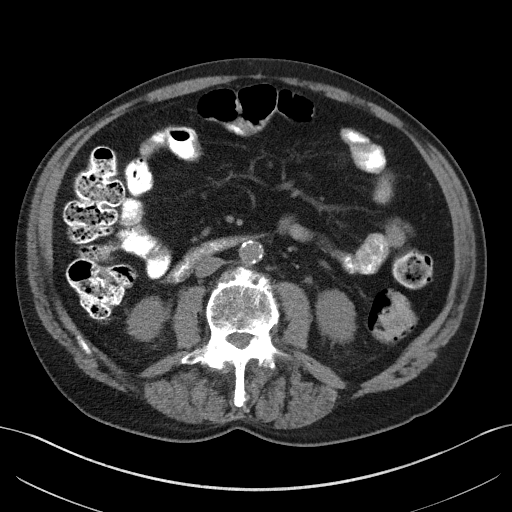
[im 59/92  bone]
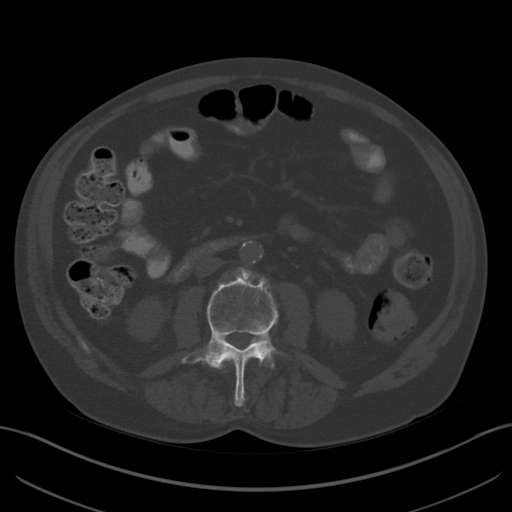
[im 66/92  soft-tissue]
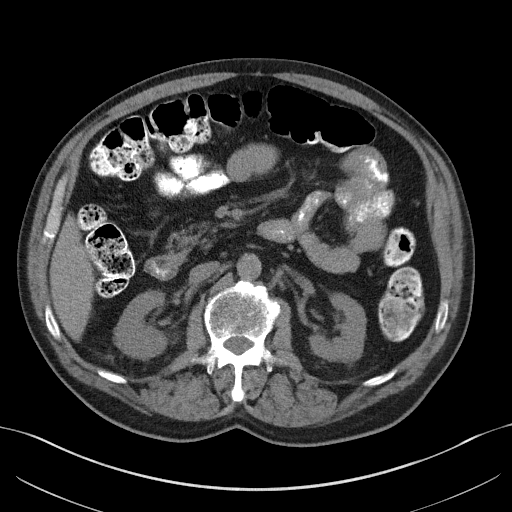
[im 73/92  soft-tissue]
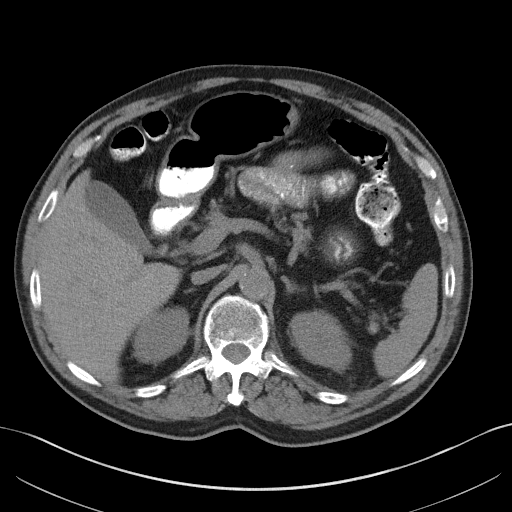
[im 77/92  lung]
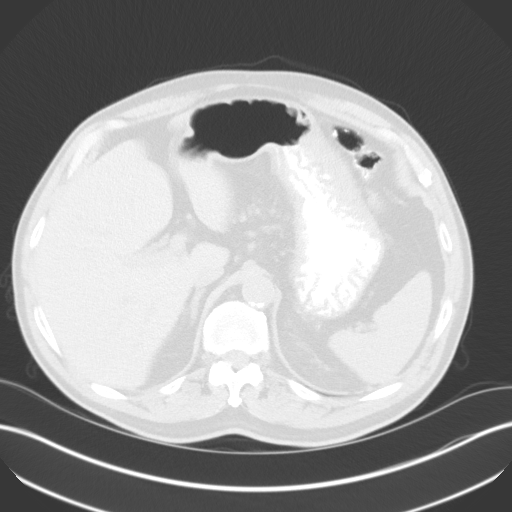
[im 81/92  soft-tissue]
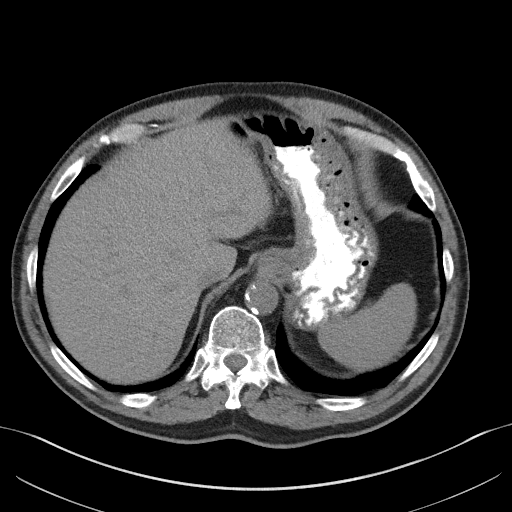
[im 81/92  lung]
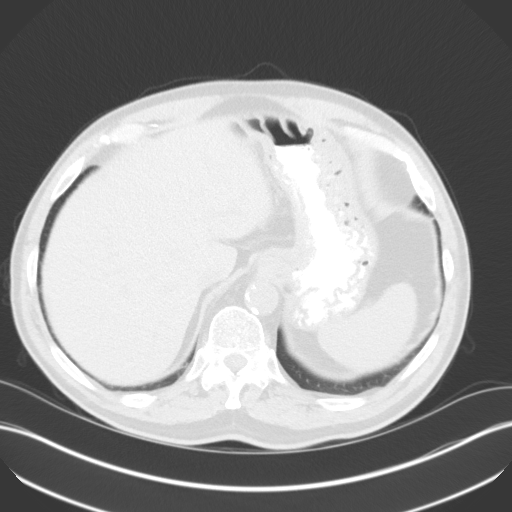
[im 84/92  lung]
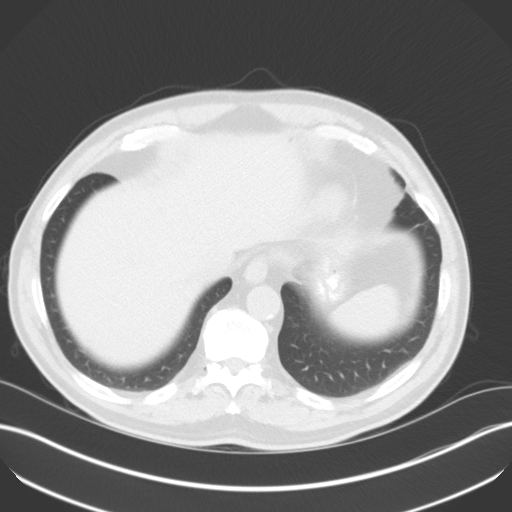
[im 88/92  soft-tissue]
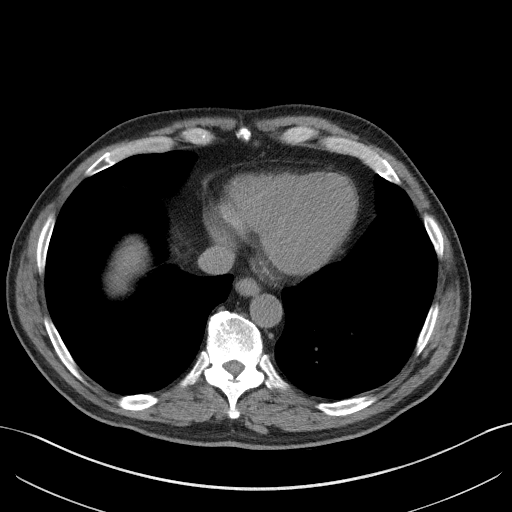
[im 88/92  lung]
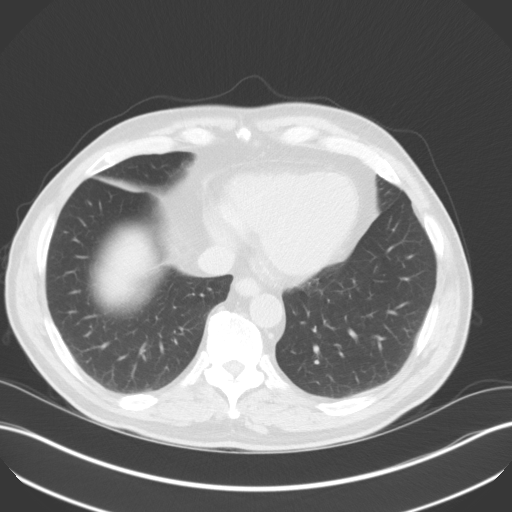

[15 of 32 positions shown; findings below may reference images not displayed]

FINDINGS: Lower chest: No acute abnormality.

Hepatobiliary: No focal liver abnormality is seen. No gallstones,
gallbladder wall thickening, or biliary dilatation.

Pancreas: Mildly atrophic. No ductal dilatation or surrounding
inflammatory changes.

Spleen: Normal in size without focal abnormality.

Adrenals/Urinary Tract: Adrenal glands are unremarkable. Mild
bilateral perinephric fat stranding. No renal calculi, focal lesion,
or hydronephrosis. Bladder is unremarkable for the degree of
distention.

Stomach/Bowel: Stomach is within normal limits. Appendix appears
normal. No evidence of bowel wall thickening, distention, or
inflammatory changes.

Vascular/Lymphatic: Aortic atherosclerosis. No enlarged abdominal or
pelvic lymph nodes.

Reproductive: Borderline enlarged with central coarse
calcifications.

Other: No abdominal wall hernia or abnormality. No abdominopelvic
ascites. No pneumoperitoneum.

Musculoskeletal: No acute or significant osseous findings. Prior
lumbar fusion.
IMPRESSION: 1. No acute intra-abdominal process, evidence of malignancy, or
lymphadenopathy.
2. Aortic Atherosclerosis ([id number]).

## 2021-05-13 ENCOUNTER — Emergency Department: Payer: Medicare HMO

## 2021-05-13 ENCOUNTER — Observation Stay
Admission: EM | Admit: 2021-05-13 | Discharge: 2021-05-15 | Disposition: A | Discharge status: Home / Self Care | Payer: Medicare HMO | Attending: Family Medicine | Admitting: Family Medicine

## 2021-05-13 DIAGNOSIS — R4182 Altered mental status, unspecified: Secondary | ICD-10-CM | POA: Diagnosis present

## 2021-05-13 DIAGNOSIS — Z79899 Other long term (current) drug therapy: Secondary | ICD-10-CM | POA: Insufficient documentation

## 2021-05-13 DIAGNOSIS — R109 Unspecified abdominal pain: Secondary | ICD-10-CM | POA: Insufficient documentation

## 2021-05-13 DIAGNOSIS — F17210 Nicotine dependence, cigarettes, uncomplicated: Secondary | ICD-10-CM | POA: Insufficient documentation

## 2021-05-13 DIAGNOSIS — I872 Venous insufficiency (chronic) (peripheral): Secondary | ICD-10-CM | POA: Insufficient documentation

## 2021-05-13 DIAGNOSIS — G9341 Metabolic encephalopathy: Principal | ICD-10-CM | POA: Insufficient documentation

## 2021-05-13 DIAGNOSIS — Z7982 Long term (current) use of aspirin: Secondary | ICD-10-CM | POA: Insufficient documentation

## 2021-05-13 DIAGNOSIS — N1832 Chronic kidney disease, stage 3b: Secondary | ICD-10-CM | POA: Diagnosis not present

## 2021-05-13 DIAGNOSIS — Z20822 Contact with and (suspected) exposure to covid-19: Secondary | ICD-10-CM | POA: Insufficient documentation

## 2021-05-13 DIAGNOSIS — M47817 Spondylosis without myelopathy or radiculopathy, lumbosacral region: Secondary | ICD-10-CM | POA: Diagnosis present

## 2021-05-13 DIAGNOSIS — I10 Essential (primary) hypertension: Secondary | ICD-10-CM | POA: Diagnosis present

## 2021-05-13 DIAGNOSIS — I129 Hypertensive chronic kidney disease with stage 1 through stage 4 chronic kidney disease, or unspecified chronic kidney disease: Secondary | ICD-10-CM | POA: Diagnosis not present

## 2021-05-13 LAB — URINE DRUG SCREEN, QUALITATIVE (ARMC ONLY)
Amphetamines, Ur Screen: NOT DETECTED
Barbiturates, Ur Screen: NOT DETECTED
Benzodiazepine, Ur Scrn: NOT DETECTED
Cannabinoid 50 Ng, Ur ~~LOC~~: NOT DETECTED
Cocaine Metabolite,Ur ~~LOC~~: NOT DETECTED
MDMA (Ecstasy)Ur Screen: NOT DETECTED
Methadone Scn, Ur: NOT DETECTED
Opiate, Ur Screen: NOT DETECTED
Phencyclidine (PCP) Ur S: NOT DETECTED
Tricyclic, Ur Screen: NOT DETECTED

## 2021-05-13 LAB — COMPREHENSIVE METABOLIC PANEL
ALT: 47 U/L — ABNORMAL HIGH (ref 0–44)
AST: 30 U/L (ref 15–41)
Albumin: 3.4 g/dL — ABNORMAL LOW (ref 3.5–5.0)
Alkaline Phosphatase: 71 U/L (ref 38–126)
Anion gap: 2 — ABNORMAL LOW (ref 5–15)
BUN: 22 mg/dL (ref 8–23)
CO2: 30 mmol/L (ref 22–32)
Calcium: 8.3 mg/dL — ABNORMAL LOW (ref 8.9–10.3)
Chloride: 105 mmol/L (ref 98–111)
Creatinine, Ser: 1.74 mg/dL — ABNORMAL HIGH (ref 0.61–1.24)
GFR, Estimated: 41 mL/min — ABNORMAL LOW (ref >60)
Glucose, Bld: 90 mg/dL (ref 70–99)
Potassium: 4 mmol/L (ref 3.5–5.1)
Sodium: 137 mmol/L (ref 135–145)
Total Bilirubin: 0.8 mg/dL (ref 0.3–1.2)
Total Protein: 6 g/dL — ABNORMAL LOW (ref 6.5–8.1)

## 2021-05-13 LAB — BLOOD GAS, ARTERIAL
Acid-Base Excess: 5 mmol/L — ABNORMAL HIGH (ref 0.0–2.0)
Bicarbonate: 29.9 mmol/L — ABNORMAL HIGH (ref 20.0–28.0)
O2 Content: 2 L/min
O2 Saturation: 98.8 %
Patient temperature: 37
pCO2 arterial: 44 mmHg (ref 32–48)
pH, Arterial: 7.44 (ref 7.35–7.45)
pO2, Arterial: 112 mmHg — ABNORMAL HIGH (ref 83–108)

## 2021-05-13 LAB — CBC WITH DIFFERENTIAL/PLATELET
Abs Immature Granulocytes: 0.02 10*3/uL (ref 0.00–0.07)
Basophils Absolute: 0 10*3/uL (ref 0.0–0.1)
Basophils Relative: 1 %
Eosinophils Absolute: 0.2 10*3/uL (ref 0.0–0.5)
Eosinophils Relative: 3 %
HCT: 37.9 % — ABNORMAL LOW (ref 39.0–52.0)
Hemoglobin: 12.3 g/dL — ABNORMAL LOW (ref 13.0–17.0)
Immature Granulocytes: 0 %
Lymphocytes Relative: 31 %
Lymphs Abs: 2 10*3/uL (ref 0.7–4.0)
MCH: 29.7 pg (ref 26.0–34.0)
MCHC: 32.5 g/dL (ref 30.0–36.0)
MCV: 91.5 fL (ref 80.0–100.0)
Monocytes Absolute: 0.5 10*3/uL (ref 0.1–1.0)
Monocytes Relative: 7 %
Neutro Abs: 3.7 10*3/uL (ref 1.7–7.7)
Neutrophils Relative %: 58 %
Platelets: 220 10*3/uL (ref 150–400)
RBC: 4.14 MIL/uL — ABNORMAL LOW (ref 4.22–5.81)
RDW: 12.3 % (ref 11.5–15.5)
WBC: 6.4 10*3/uL (ref 4.0–10.5)
nRBC: 0 % (ref 0.0–0.2)

## 2021-05-13 LAB — URINALYSIS, ROUTINE W REFLEX MICROSCOPIC
Bilirubin Urine: NEGATIVE
Glucose, UA: NEGATIVE mg/dL
Hgb urine dipstick: NEGATIVE
Ketones, ur: NEGATIVE mg/dL
Leukocytes,Ua: NEGATIVE
Nitrite: NEGATIVE
Protein, ur: NEGATIVE mg/dL
Specific Gravity, Urine: 1.021 (ref 1.005–1.030)
pH: 6 (ref 5.0–8.0)

## 2021-05-13 LAB — ACETAMINOPHEN LEVEL: Acetaminophen (Tylenol), Serum: <10 ug/mL — ABNORMAL LOW (ref 10–30)

## 2021-05-13 LAB — TROPONIN I (HIGH SENSITIVITY)
Troponin I (High Sensitivity): 4 ng/L (ref <18)
Troponin I (High Sensitivity): 4 ng/L (ref <18)

## 2021-05-13 LAB — ETHANOL: Alcohol, Ethyl (B): <10 mg/dL (ref <10)

## 2021-05-13 LAB — TSH: TSH: 0.582 u[IU]/mL (ref 0.350–4.500)

## 2021-05-13 LAB — RESP PANEL BY RT-PCR (FLU A&B, COVID) ARPGX2
Influenza A by PCR: NEGATIVE
Influenza B by PCR: NEGATIVE
SARS Coronavirus 2 by RT PCR: NEGATIVE

## 2021-05-13 LAB — LACTIC ACID, PLASMA: Lactic Acid, Venous: 0.7 mmol/L (ref 0.5–1.9)

## 2021-05-13 LAB — SALICYLATE LEVEL: Salicylate Lvl: <7 mg/dL — ABNORMAL LOW (ref 7.0–30.0)

## 2021-05-13 MED ORDER — IOHEXOL 300 MG/ML  SOLN
100.0000 mL | Freq: Once | INTRAMUSCULAR | Status: AC | PRN
  Administered 2021-05-13: 100 mL via INTRAVENOUS

## 2021-05-13 MED ORDER — ACETAMINOPHEN 325 MG PO TABS
650.0000 mg | ORAL_TABLET | Freq: Once | ORAL | Status: DC
  Filled 2021-05-13: qty 2

## 2021-05-13 MED ORDER — LIDOCAINE HCL URETHRAL/MUCOSAL 2 % EX GEL
1.0000 "application " | Freq: Once | CUTANEOUS | Status: AC
  Administered 2021-05-13: 1 via URETHRAL
  Filled 2021-05-13: qty 10

## 2021-05-13 NOTE — ED Notes (Signed)
Pt straight cathed by this RN and tech [employer]. No complications.  ?

## 2021-05-13 NOTE — ED Notes (Signed)
Pt unable to wake up enough to take tylenol ?MD aware  ? ?

## 2021-05-13 NOTE — ED Notes (Signed)
Pt repositioned in bed, warm blankets given and socks as well.  ?

## 2021-05-13 NOTE — ED Provider Notes (Signed)
? ?[facility] ?Provider Note ? ? ? Event Date/Time  ? First MD Initiated Contact with Patient 05/13/21 1651   ?  (approximate) ? ? ?History  ? ?Altered Mental Status (Started today, family called, pt drowsy, when awake axox4 , vs wdl ) ? ? ?HPI ? ?Howard Lucas is a 71 y.o. male who was brought in by EMS because he is unable to be woken up at home.  EMS reports his CBG was about 140.  CO2 on the detector was about 36.  Patient will say a couple words to EMS and go back to sleep.  When I see him he is already sleeping fairly soundly and will not wake up.  He wakes up and speaks to [place] after he has a COVID swab done.  He says he has been sleepy for 2 days.  He is taking his sleeping pills in the evening as directed.  He has not taken any extra medications.  He is not sure why he is sleepy.  He complains of a little bit of right-sided abdominal pain.  When he was asleep when I palpated his abdomen on the right he would jump and reach toward his abdomen. ? ?  ? ? ?Physical Exam  ? ?Triage Vital Signs: ?ED Triage Vitals [05/13/21 1650]  ?Enc Vitals Group  ?   BP 132/67  ?   Pulse Rate 67  ?   Resp 17  ?   Temp 98.5 ?F (36.9 ?C)  ?   Temp Source Axillary  ?   SpO2 98 %  ?   Weight 176 lb 5.9 oz (80 kg)  ?   Height '5\' 10"'$  (1.778 m)  ?   Head Circumference   ?   Peak Flow   ?   Pain Score   ?   Pain Loc   ?   Pain Edu?   ?   Excl. in [place]?   ? ? ?Most recent vital signs: ?Vitals:  ? 05/13/21 2200 05/13/21 2303  ?BP: 126/76 122/70  ?Pulse: 69 78  ?Resp: 16 15  ?Temp:    ?SpO2: 100% 100%  ? ? ?General: Sleepy, difficult to arouse ?Head normocephalic atraumatic ?Eyes pupils equal round.  When he is asleep he has roving eye movements when he is awake he can stop focus. ?CV:  Good peripheral perfusion.  Heart regular rate and rhythm no audible murmurs ?Resp:  Normal effort.  Lungs are clear but he is not breathing very deeply ?Abd:  No distention.  Abdomen soft somewhat tender as described in HPI but not  markedly so.  He has a couple tinkling bowel sounds but most bowel sounds are normal ?\Extremities: No edema no obvious rash ? ? ?ED Results / Procedures / Treatments  ? ?Labs ?(all labs ordered are listed, but only abnormal results are displayed) ?Labs Reviewed  ?COMPREHENSIVE METABOLIC PANEL - Abnormal; Notable for the following components:  ?    Result Value  ? Creatinine, Ser 1.74 (*)   ? Calcium 8.3 (*)   ? Total Protein 6.0 (*)   ? Albumin 3.4 (*)   ? ALT 47 (*)   ? GFR, Estimated 41 (*)   ? Anion gap 2 (*)   ? All other components within normal limits  ?ACETAMINOPHEN LEVEL - Abnormal; Notable for the following components:  ? Acetaminophen (Tylenol), Serum <10 (*)   ? All other components within normal limits  ?SALICYLATE LEVEL - Abnormal; Notable for the following components:  ?  Salicylate Lvl <4.0 (*)   ? All other components within normal limits  ?CBC WITH DIFFERENTIAL/PLATELET - Abnormal; Notable for the following components:  ? RBC 4.14 (*)   ? Hemoglobin 12.3 (*)   ? HCT 37.9 (*)   ? All other components within normal limits  ?URINALYSIS, ROUTINE W REFLEX MICROSCOPIC - Abnormal; Notable for the following components:  ? Color, Urine YELLOW (*)   ? APPearance CLEAR (*)   ? All other components within normal limits  ?BLOOD GAS, ARTERIAL - Abnormal; Notable for the following components:  ? pO2, Arterial 112 (*)   ? Bicarbonate 29.9 (*)   ? Acid-Base Excess 5.0 (*)   ? All other components within normal limits  ?RESP PANEL BY RT-PCR (FLU A&B, COVID) ARPGX2  ?ETHANOL  ?LACTIC ACID, PLASMA  ?URINE DRUG SCREEN, QUALITATIVE ([facility] ONLY)  ?TSH  ?LACTIC ACID, PLASMA  ?CBG MONITORING, ED  ?TROPONIN I (HIGH SENSITIVITY)  ?TROPONIN I (HIGH SENSITIVITY)  ? ? ? ?EKG ? ?EKG read and interpreted by me shows normal sinus rhythm rate of 63 normal axis no acute ST-T wave changes there is some PR segment depression in the inferior leads there is no ST elevation ? ? ?RADIOLOGY ? ?X-ray read by radiology films reviewed by me  shows no acute disease ?CT abdomen read by radiology reviewed by me shows only some bladder wall thickening. ?CT head read by radiology reviewed by me shows only chronic appearing sinusitis ?PROCEDURES: ? ?Critical Care performed: Critical care time at least 50 minutes.  This includes speaking with the daughter and the wife and reviewing the patient's records that were available and thinking very carefully about him and examining him several times interpreting and reviewing all of his tests.  Additionally and talking with the hospitalist ? ?Procedures ? ? ?MEDICATIONS ORDERED IN ED: ?Medications  ?acetaminophen (TYLENOL) tablet 650 mg (has no administration in time range)  ?iohexol (OMNIPAQUE) 300 MG/ML solution 100 mL (100 mLs Intravenous Contrast Given 05/13/21 1802)  ?lidocaine (XYLOCAINE) 2 % jelly 1 application. (1 application. Urethral Given 05/13/21 2108)  ? ? ? ?IMPRESSION / MDM / ASSESSMENT AND PLAN / ED COURSE  ?I reviewed the triage vital signs and the nursing notes. ?Patient's urine drug screen is negative patient's urinalysis is normal he does not have COVID and the flu his TSH is normal his troponin is normal his ABG shows a little bit elevated O2 because he was on oxygen when it was done.  His electrolytes are okay GFR is 41 but it has been in that range.  Creatinine stable from 2 years ago.  His lactic acid is normal his CBC is essentially normal as well as slight anemia.  His acetaminophen and salicylate levels are negative.  His alcohol level is negative.  MRI of the brain is negative. ? ?I am not sure why he is very sleepy.  He will wake up briefly under extreme stimulation like the COVID swab in his nose or a Foley catheter.  He seems to be fine and then goes immediately back to sleep. ?he patient is on the cardiac monitor to evaluate for evidence of arrhythmia and/or significant heart rate changes.  None were seen ? ?  ? ? ?FINAL CLINICAL IMPRESSION(S) / ED DIAGNOSES  ? ?Final diagnoses:  ?Altered  mental status, unspecified altered mental status type  ? ? ? ?Rx / DC Orders  ? ?ED Discharge Orders   ? ? None  ? ?  ? ? ? ?Note:  This  document was prepared using Systems analyst and may include unintentional dictation errors. ?  ?Nena Polio, MD ?05/13/21 2325 ? ?

## 2021-05-13 NOTE — ED Triage Notes (Signed)
Started today, family called, pt drowsy, when awake axox4 , vs wdl  ?

## 2021-05-14 ENCOUNTER — Other Ambulatory Visit: Payer: Self-pay

## 2021-05-14 DIAGNOSIS — G9341 Metabolic encephalopathy: Secondary | ICD-10-CM

## 2021-05-14 LAB — LACTIC ACID, PLASMA: Lactic Acid, Venous: 0.8 mmol/L (ref 0.5–1.9)

## 2021-05-14 LAB — PHOSPHORUS: Phosphorus: 3.4 mg/dL (ref 2.5–4.6)

## 2021-05-14 LAB — AMMONIA: Ammonia: 26 umol/L (ref 9–35)

## 2021-05-14 LAB — MAGNESIUM: Magnesium: 1.9 mg/dL (ref 1.7–2.4)

## 2021-05-14 MED ORDER — ONDANSETRON HCL 4 MG PO TABS: 4.0000 mg | ORAL_TABLET | Freq: Four times a day (QID) | ORAL | Status: DC | PRN

## 2021-05-14 MED ORDER — ENOXAPARIN SODIUM 40 MG/0.4ML IJ SOSY
40.0000 mg | PREFILLED_SYRINGE | INTRAMUSCULAR | Status: DC
  Administered 2021-05-14: 40 mg via SUBCUTANEOUS
  Filled 2021-05-14 (×2): qty 0.4

## 2021-05-14 MED ORDER — NICOTINE 14 MG/24HR TD PT24
14.0000 mg | MEDICATED_PATCH | Freq: Every day | TRANSDERMAL | Status: DC
  Administered 2021-05-14: 14 mg via TRANSDERMAL
  Filled 2021-05-14: qty 1

## 2021-05-14 MED ORDER — ACETAMINOPHEN 325 MG PO TABS
650.0000 mg | ORAL_TABLET | Freq: Four times a day (QID) | ORAL | Status: DC | PRN
  Administered 2021-05-14: 650 mg via ORAL
  Filled 2021-05-14: qty 2

## 2021-05-14 MED ORDER — LACTATED RINGERS IV SOLN: INTRAVENOUS | Status: DC

## 2021-05-14 MED ORDER — ACETAMINOPHEN 650 MG RE SUPP: 650.0000 mg | Freq: Four times a day (QID) | RECTAL | Status: DC | PRN

## 2021-05-14 MED ORDER — ONDANSETRON HCL 4 MG/2ML IJ SOLN: 4.0000 mg | Freq: Four times a day (QID) | INTRAMUSCULAR | Status: DC | PRN

## 2021-05-14 MED ORDER — HYDROCORTISONE 0.5 % EX CREA
1.0000 "application " | TOPICAL_CREAM | CUTANEOUS | Status: DC | PRN
  Administered 2021-05-14: 1 via TOPICAL
  Filled 2021-05-14: qty 28.35

## 2021-05-14 NOTE — Assessment & Plan Note (Addendum)
Etiology uncertain at admission but suspect toxic/medication related.  Patient has been taking Neurontin and trazodone more frequently at night.  Toxicology work-up negative.  ?CT head, MRI brain unremarkable. ?Recommend neurochecks. ?All the labs, imaging and work-up unremarkable. ?Patient is being discharged home. ?

## 2021-05-14 NOTE — Assessment & Plan Note (Addendum)
Resume home blood pressure medications. ?

## 2021-05-14 NOTE — Assessment & Plan Note (Signed)
Patient with reversed AG ratio ?Will order SPEP, UPEP ?

## 2021-05-14 NOTE — Care Plan (Signed)
This 71 years old male with PMH significant for hypertension, postherpetic neuralgia, CKD stage IIIb presented in the ED with 2-day history of increased somnolence.  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  There is no reported ingestion or recent change in medications.  However patient been taking Neurontin and trazodone more frequently.  ED work-up has been unremarkable.  ABG: WNL, labs unremarkable.  Serum creatinine 1.74 which is his baseline.  Urine drug screen negative, EtOH / Tylenol level within normal limits.  CT head and MRI unremarkable.  CT abdomen and pelvis, chest x-ray without acute finding.  Patient was admitted for altered mental status was given supportive care IV fluids.  Patient was seen and examined in the morning,  appears much improved.  Back to his baseline mental status,  slightly confused but following full commands.  No focal neurological deficits noted.  Daughter and wife at bedside. ?

## 2021-05-14 NOTE — Assessment & Plan Note (Signed)
No acute issues.

## 2021-05-14 NOTE — Assessment & Plan Note (Signed)
Renal function stable.

## 2021-05-14 NOTE — Assessment & Plan Note (Signed)
Tylenol as needed.

## 2021-05-14 NOTE — H&P (Signed)
?History and Physical  ? ? ?Patient: Howard Lucas IRS:[id number] DOB: 1950/03/20 ?DOA: 05/13/2021 ?DOS: the patient was seen and examined on 05/14/2021 ?PCP: Theotis Burrow, MD  ?Patient coming from: Home ? ?Chief Complaint:  ?Chief Complaint  ?Patient presents with  ? Altered Mental Status  ?  Started today, family called, pt drowsy, when awake axox4 , vs wdl   ? ? ?HPI: Howard Lucas is a 71 y.o. male with medical history significant for HTN, post herpetic neuralgia and CKD stage IIIb who presents to the ED with a 2-day history of somnolence.  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  No reported ingestion or recent change in medication.  He is noted to be however on Neurontin and trazodone ?ED course: Vitals within normal limits.  On blood work ABG unremarkable, CBC unremarkable except for mildly depressed hemoglobin of 12.3, lactic acid normal, CMP significant for creatinine of 1.74 which is his baseline.  Total protein slightly low at 6 with albumin of 3.4.  Toxicology work-up reveals normal UDS, EtOH less than 10, acetaminophen level less than 10 and salicylate acid less than 7.  EKG, personally viewed and interpreted: Sinus at 63 with nonspecific ST-T wave changes.  Imaging, including MRI brain, CT head brain, CT abdomen and pelvis as well as chest x-ray with no acute findings. ?Patient given an IV fluid bolus.  He was observed for few hours in the ED but continued to be very somnolent, hospitalist consulted for admission  ? ?Review of Systems  ?Unable to perform ROS: Mental status change  ? ?Past Medical History:  ?Diagnosis Date  ? Alcohol abuse   ? Fatigue   ? GERD (gastroesophageal reflux disease)   ? Heart murmur   ? Hyperlipidemia   ? Hypertension   ? Knee pain, left   ? Memory impairment   ? Sleeping difficulties   ? ?Past Surgical History:  ?Procedure Laterality Date  ? BACK SURGERY    ? COLONOSCOPY    ? COLONOSCOPY WITH  PROPOFOL N/A 09/14/2019  ? Procedure: COLONOSCOPY WITH PROPOFOL;  Surgeon: Lucilla Lame, MD;  Location: [facility] ENDOSCOPY;  Service: Endoscopy;  Laterality: N/A;  ? KNEE SURGERY Bilateral   ? ?Social History:  reports that he has been smoking. He has a 102.00 pack-year smoking history. He has never used smokeless tobacco. He reports current alcohol use of about 70.0 standard drinks per week. He reports that he does not use drugs. ? ?No Known Allergies ? ?Family History  ?Problem Relation Age of Onset  ? Other Mother   ?     "blood clot in the heart"  ? Diabetes Father   ? Cancer Sister   ? Cancer Brother   ? ? ?Prior to Admission medications   ?Medication Sig Start Date End Date Taking? Authorizing Provider  ?aspirin 81 MG chewable tablet Chew 81 mg as needed by mouth.   Yes [provider]  ?atorvastatin (LIPITOR) 80 MG tablet Take 80 mg daily by mouth.   Yes [provider]  ?B Complex Vitamins (B COMPLEX-B12 PO) Take 1 tablet by mouth daily.   Yes [provider]  ?budesonide-formoterol (SYMBICORT) 160-4.5 MCG/ACT inhaler Inhale 2 puffs into the lungs 2 (two) times daily.   Yes [provider]  ?DULoxetine (CYMBALTA) 60 MG capsule Take 60 mg by mouth daily. 01/06/21  Yes [provider]  ?gabapentin (NEURONTIN) 100 MG capsule Take  100 mg by mouth 3 (three) times daily. 01/12/21  Yes [provider]  ?hydrochlorothiazide (MICROZIDE) 12.5 MG capsule Take 12.5 mg by mouth daily. 03/30/21  Yes [provider]  ?lisinopril (PRINIVIL,ZESTRIL) 20 MG tablet Take 20 mg daily by mouth.   Yes [provider]  ?meloxicam (MOBIC) 15 MG tablet Take 15 mg daily by mouth.   Yes [provider]  ?omeprazole (PRILOSEC) 40 MG capsule Take 40 mg by mouth 2 (two) times daily. 04/20/21  Yes [provider]  ?senna (SENOKOT) 8.6 MG TABS tablet Take 2 tablets by mouth daily. 04/20/21  Yes [provider]  ?traZODone (DESYREL) 100 MG tablet Take  100 mg at bedtime by mouth.   Yes [provider]  ?albuterol (PROVENTIL HFA;VENTOLIN HFA) 108 (90 Base) MCG/ACT inhaler Inhale into the lungs every 6 (six) hours as needed for wheezing or shortness of breath.    [provider]  ?nitroGLYCERIN (NITROSTAT) 0.4 MG SL tablet Place 1 tablet (0.4 mg total) every 5 (five) minutes as needed under the tongue for chest pain. 12/15/16 03/15/17  End, Harrell Gave, MD  ?valACYclovir (VALTREX) 1000 MG tablet Take 1 tablet (1,000 mg total) by mouth 3 (three) times daily. ?Patient not taking: Reported on 05/13/2021 05/03/20   Verda Cumins, MD  ? ? ?Physical Exam: ?Vitals:  ? 05/14/21 0000 05/14/21 0130 05/14/21 0230 05/14/21 0300  ?BP: 111/60 (!) 109/55 (!) 107/47 (!) 115/54  ?Pulse: 68 64 64 66  ?Resp: 13 (!) '26 14 15  '$ ?Temp:      ?TempSrc:      ?SpO2: 99% 99% 99% 100%  ?Weight:      ?Height:      ? ?Physical Exam ?Vitals and nursing note reviewed.  ?Constitutional:   ?   General: He is sleeping. He is not in acute distress. ?HENT:  ?   Head: Normocephalic and atraumatic.  ?Cardiovascular:  ?   Rate and Rhythm: Normal rate and regular rhythm.  ?   Pulses: Normal pulses.  ?   Heart sounds: Normal heart sounds.  ?Pulmonary:  ?   Effort: Pulmonary effort is normal.  ?   Breath sounds: Normal breath sounds.  ?Abdominal:  ?   Palpations: Abdomen is soft.  ?   Tenderness: There is no abdominal tenderness.  ?Skin: ?   Findings: No lesion or rash.  ?Neurological:  ?   General: No focal deficit present.  ?   Mental Status: He is lethargic.  ? ? ? ?Data Reviewed: ?Relevant notes from primary care and specialist visits, past discharge summaries as available in EHR, including Care Everywhere. ?Prior diagnostic testing as pertinent to current admission diagnoses ?Updated medications and problem lists for reconciliation ?ED course, including vitals, labs, imaging, treatment and response to treatment ?Triage notes, nursing and pharmacy notes and ED provider's notes ?Notable  results as noted in HPI ? ? ?Assessment and Plan: ?* Acute metabolic encephalopathy ?Etiology uncertain but suspect medication related.  Patient currently takes Neurontin and trazodone, though not new.  ? Toxicology work-up negative.  Brain imaging unrevealing ?Neurologic checks ?We will keep n.p.o. overnight ?Can consider neuro consult in the am if still somnolent ? ?Stage 3b chronic kidney disease ([place]) ?Renal function stable ? ?Chronic venous insufficiency ?No acute issues. ? ?DJD (degenerative joint disease), lumbosacral ?Tylenol as needed ? ?Essential hypertension ?IV as needed antihypertensives as patient will be n.p.o. ? ? ? ? ? ? ?Advance Care Planning:   Code Status: Full Code  ? ?Consults: none ? ?  Family Communication: wife and daughter ? ?Severity of Illness: ?The appropriate patient status for this patient is OBSERVATION. Observation status is judged to be reasonable and necessary in order to provide the required intensity of service to ensure the patient's safety. The patient's presenting symptoms, physical exam findings, and initial radiographic and laboratory data in the context of their medical condition is felt to place them at decreased risk for further clinical deterioration. Furthermore, it is anticipated that the patient will be medically stable for discharge from the hospital within 2 midnights of admission.  ? ?Author: ?Athena Masse, MD ?05/14/2021 4:19 AM ? ?For on call review [email or web address].  ?

## 2021-05-15 DIAGNOSIS — G9341 Metabolic encephalopathy: Secondary | ICD-10-CM | POA: Diagnosis not present

## 2021-05-15 LAB — PROTEIN ELECTROPHORESIS, SERUM
A/G Ratio: 1.5 (ref 0.7–1.7)
Albumin ELP: 3.4 g/dL (ref 2.9–4.4)
Alpha-1-Globulin: 0.2 g/dL (ref 0.0–0.4)
Alpha-2-Globulin: 0.6 g/dL (ref 0.4–1.0)
Beta Globulin: 0.8 g/dL (ref 0.7–1.3)
Gamma Globulin: 0.8 g/dL (ref 0.4–1.8)
Globulin, Total: 2.3 g/dL (ref 2.2–3.9)
Total Protein ELP: 5.7 g/dL — ABNORMAL LOW (ref 6.0–8.5)

## 2021-05-15 LAB — COMPREHENSIVE METABOLIC PANEL
ALT: 45 U/L — ABNORMAL HIGH (ref 0–44)
AST: 31 U/L (ref 15–41)
Albumin: 3.4 g/dL — ABNORMAL LOW (ref 3.5–5.0)
Alkaline Phosphatase: 69 U/L (ref 38–126)
Anion gap: 5 (ref 5–15)
BUN: 21 mg/dL (ref 8–23)
CO2: 29 mmol/L (ref 22–32)
Calcium: 8.8 mg/dL — ABNORMAL LOW (ref 8.9–10.3)
Chloride: 101 mmol/L (ref 98–111)
Creatinine, Ser: 1.7 mg/dL — ABNORMAL HIGH (ref 0.61–1.24)
GFR, Estimated: 43 mL/min — ABNORMAL LOW (ref >60)
Glucose, Bld: 90 mg/dL (ref 70–99)
Potassium: 4.1 mmol/L (ref 3.5–5.1)
Sodium: 135 mmol/L (ref 135–145)
Total Bilirubin: 0.8 mg/dL (ref 0.3–1.2)
Total Protein: 6 g/dL — ABNORMAL LOW (ref 6.5–8.1)

## 2021-05-15 LAB — CBC
HCT: 34.7 % — ABNORMAL LOW (ref 39.0–52.0)
Hemoglobin: 12 g/dL — ABNORMAL LOW (ref 13.0–17.0)
MCH: 30.8 pg (ref 26.0–34.0)
MCHC: 34.6 g/dL (ref 30.0–36.0)
MCV: 89.2 fL (ref 80.0–100.0)
Platelets: 195 10*3/uL (ref 150–400)
RBC: 3.89 MIL/uL — ABNORMAL LOW (ref 4.22–5.81)
RDW: 12.1 % (ref 11.5–15.5)
WBC: 6.2 10*3/uL (ref 4.0–10.5)
nRBC: 0 % (ref 0.0–0.2)

## 2021-05-15 NOTE — Discharge Summary (Signed)
?Physician Discharge Summary ?  ?Patient: Howard Lucas MRN: [id number] DOB: 1950-12-23  ?Admit date:     05/13/2021  ?Discharge date: 05/15/21  ?Discharge Physician: Shawna Clamp  ? ?PCP: Theotis Burrow, MD  ? ?Recommendations at discharge:  ?Advised to follow-up with primary care physician in 1 week. ?Advised to hold trazodone and gabapentin until follow-up with primary care physician. ?Please check CBC and BMP in 1 week. ? ?Discharge Diagnoses: ?Principal Problem: ?  Acute metabolic encephalopathy ?Active Problems: ?  Essential hypertension ?  DJD (degenerative joint disease), lumbosacral ?  Chronic venous insufficiency ?  Stage 3b chronic kidney disease ([place]) ? ?Resolved Problems: ?  * No resolved hospital problems. * ? ?Hospital Course: ?This 71 years old male with PMH significant for hypertension, postherpetic neuralgia, CKD stage IIIb presented in the ED with 2-day history of increased somnolence.  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  There is no reported ingestion or recent change in medications.  However patient been taking Neurontin and trazodone more frequently in the night.  ED work-up has been unremarkable.  ABG: WNL, labs unremarkable. Serum creatinine 1.74 which is his baseline.  Urine drug screen negative, EtOH / Tylenol level within normal limits.  CT head and MRI unremarkable.  CT abdomen and pelvis, chest x-ray without acute finding.  Patient was admitted for altered mental status  and was given supportive care,  IV fluids.  Patient was seen and examined in the morning,  appears much improved. back to his baseline mental status,  No focal neurological deficits noted.  Daughter and wife at bedside.  They states patient is back to normal. Patient is being discharged home. ? ?Assessment and Plan: ?* Acute metabolic encephalopathy ?Etiology uncertain at admission but suspect toxic/medication related.  Patient has been  taking Neurontin and trazodone more frequently at night.  Toxicology work-up negative.  ?CT head, MRI brain unremarkable. ?Recommend neurochecks. ?All the labs, imaging and work-up unremarkable. ?Patient is being discharged home. ? ?Stage 3b chronic kidney disease ([place]) ?Renal function stable ? ?Chronic venous insufficiency ?No acute issues. ? ?DJD (degenerative joint disease), lumbosacral ?Tylenol as needed ? ?Essential hypertension ?Resume home blood pressure medications. ? ? ?Pain control - [employer] Controlled Substance Reporting System database was reviewed. and patient was instructed, not to drive, operate heavy machinery, perform activities at heights, swimming or participation in water activities or provide baby-sitting services while on Pain, Sleep and Anxiety Medications; until their outpatient Physician has advised to do so again. Also recommended to not to take more than prescribed Pain, Sleep and Anxiety Medications.  ? ?Consultants: None ? ?Procedures performed: CT head, MRI ? ?Disposition: Home ? ?Diet recommendation:  ?Discharge Diet Orders (From admission, onward)  ? ?  Start     Ordered  ? 05/15/21 0000  Diet - low sodium heart healthy       ? 05/15/21 1003  ? 05/15/21 0000  Diet Carb Modified       ? 05/15/21 1003  ? ?  ?  ? ?  ? ?Carb modified diet ?DISCHARGE MEDICATION: ?Allergies as of 05/15/2021   ?No Known Allergies ?  ? ?  ?Medication List  ?  ? ?STOP taking these medications   ? ?meloxicam 15 MG tablet ?Commonly known as: MOBIC ?  ?traZODone 100 MG tablet ?Commonly known as: DESYREL ?  ?valACYclovir 1000 MG tablet ?Commonly known as: VALTREX ?  ? ?  ? ?  TAKE these medications   ? ?albuterol 108 (90 Base) MCG/ACT inhaler ?Commonly known as: VENTOLIN HFA ?Inhale into the lungs every 6 (six) hours as needed for wheezing or shortness of breath. ?  ?aspirin 81 MG chewable tablet ?Chew 81 mg as needed by mouth. ?  ?atorvastatin 80 MG tablet ?Commonly known as: LIPITOR ?Take 80 mg daily by  mouth. ?  ?B COMPLEX-B12 PO ?Take 1 tablet by mouth daily. ?  ?budesonide-formoterol 160-4.5 MCG/ACT inhaler ?Commonly known as: SYMBICORT ?Inhale 2 puffs into the lungs 2 (two) times daily. ?  ?DULoxetine 60 MG capsule ?Commonly known as: CYMBALTA ?Take 60 mg by mouth daily. ?  ?gabapentin 100 MG capsule ?Commonly known as: NEURONTIN ?Take 100 mg by mouth 3 (three) times daily. ?  ?hydrochlorothiazide 12.5 MG capsule ?Commonly known as: MICROZIDE ?Take 12.5 mg by mouth daily. ?  ?hydrocortisone cream 0.5 % ?Apply 1 application. topically as needed for itching (scalp itching). ?  ?lisinopril 20 MG tablet ?Commonly known as: ZESTRIL ?Take 20 mg daily by mouth. ?  ?nitroGLYCERIN 0.4 MG SL tablet ?Commonly known as: NITROSTAT ?Place 1 tablet (0.4 mg total) every 5 (five) minutes as needed under the tongue for chest pain. ?  ?omeprazole 40 MG capsule ?Commonly known as: PRILOSEC ?Take 40 mg by mouth 2 (two) times daily. ?  ?senna 8.6 MG Tabs tablet ?Commonly known as: SENOKOT ?Take 2 tablets by mouth daily. ?  ? ?  ? ? Follow-up Information   ? ? Revelo, Elyse Jarvis, MD Follow up in 1 week(s).   ?Specialty: Family Medicine ?Contact information: ?[address] ?[address] ?[place] [address] ?[phone] ? ? ?  ?  ? ?  ?  ? ?  ? ?Discharge Exam: ?Filed Weights  ? 05/13/21 1650  ?Weight: 80 kg  ? ? ?General exam: Appears comfortable, not in any acute distress.   ?Respiratory system: CTA bilaterally, no wheezing, no crackles, normal respiratory effort. ?Cardiovascular system: S1-S2 heard, regular rate and rhythm, no murmur. ?Gastrointestinal system: Abdomen is soft, non tender, non distended, BS+ ?Central nervous system: Alert, oriented x 3, no focal neurological deficits. ?Extremities: No edema, no cyanosis, no clubbing. ?Psychiatry: Mood, insight, judgment normal. ? ?Condition at discharge: good ? ?The results of significant diagnostics from this hospitalization (including imaging, microbiology, ancillary and  laboratory) are listed below for reference.  ? ?Imaging Studies: ?CT HEAD WO CONTRAST (5MM) ? ?Result Date: 05/13/2021 ?CLINICAL DATA:  Mental status change, unknown cause.  Drowsiness. EXAM: CT HEAD WITHOUT CONTRAST TECHNIQUE: Contiguous axial images were obtained from the base of the skull through the vertex without intravenous contrast. RADIATION DOSE REDUCTION: This exam was performed according to the departmental dose-optimization program which includes automated exposure control, adjustment of the mA and/or kV according to patient size and/or use of iterative reconstruction technique. COMPARISON:  None. FINDINGS: Brain: No evidence of parenchymal hemorrhage or extra-axial fluid collection. No mass lesion, mass effect, or midline shift. No CT evidence of acute infarction. Cerebral volume is age appropriate. No ventriculomegaly. Vascular: No acute abnormality. Skull: No evidence of calvarial fracture. Sinuses/Orbits: No air-fluid levels. Mucoperiosteal thickening throughout the right maxillary sinus. Other:  The mastoid air cells are unopacified. IMPRESSION: 1. No evidence of acute intracranial abnormality. 2. Chronic appearing right maxillary sinusitis. Electronically Signed   By: Ilona Sorrel M.D.   On: 05/13/2021 18:44  ? ?MR BRAIN WO CONTRAST ? ?Result Date: 05/13/2021 ?CLINICAL DATA:  Initial evaluation for acute mental status change, unknown cause. EXAM: MRI HEAD WITHOUT  CONTRAST TECHNIQUE: Multiplanar, multiecho pulse sequences of the brain and surrounding structures were obtained without intravenous contrast. COMPARISON:  Prior CT from earlier the same day. FINDINGS: Brain: Cerebral volume within normal limits. Minimal patchy T2/FLAIR hyperintensity involving the periventricular white matter and pons, most like related chronic microvascular ischemic disease, extremely mild for age. No evidence for acute or subacute infarct. Gray-white matter differentiation maintained. No encephalomalacia to suggest  chronic cortical infarction. No acute or chronic intracranial hemorrhage. No mass lesion, midline shift or mass effect no hydrocephalus or extra-axial fluid collection. Pituitary gland and suprasellar region within nor

## 2021-05-15 NOTE — Discharge Instructions (Signed)
Advised to follow-up with primary care physician in 1 week. ?Advised to hold trazodone and gabapentin until follow-up with primary care physician. ?

## 2021-05-15 NOTE — Care Management (Signed)
Patient had questions about switching providers.  RNCM was about to speak with patient but RN had already discharged him.  RNCM did not get the opportunity to see/speak to patient due to rapid discharge timing. ?

## 2021-07-24 ENCOUNTER — Ambulatory Visit
Admission: EM | Admit: 2021-07-24 | Discharge: 2021-07-24 | Disposition: A | Discharge status: Home / Self Care | Payer: Medicare HMO | Attending: Emergency Medicine | Admitting: Emergency Medicine

## 2021-07-24 DIAGNOSIS — W57XXXA Bitten or stung by nonvenomous insect and other nonvenomous arthropods, initial encounter: Secondary | ICD-10-CM | POA: Insufficient documentation

## 2021-07-24 DIAGNOSIS — L299 Pruritus, unspecified: Secondary | ICD-10-CM

## 2021-07-24 DIAGNOSIS — L03312 Cellulitis of back [any part except buttock]: Secondary | ICD-10-CM | POA: Diagnosis not present

## 2021-07-24 MED ORDER — DOXYCYCLINE HYCLATE 100 MG PO CAPS: 100.0000 mg | ORAL_CAPSULE | Freq: Two times a day (BID) | ORAL | 0 refills | Status: AC

## 2021-07-24 MED ORDER — TRIAMCINOLONE ACETONIDE 0.1 % EX CREA: 1.0000 | TOPICAL_CREAM | Freq: Two times a day (BID) | CUTANEOUS | 0 refills | Status: DC

## 2021-07-27 LAB — LYME DISEASE SEROLOGY W/REFLEX: Lyme Total Antibody EIA: NEGATIVE

## 2021-07-28 LAB — ROCKY MTN SPOTTED FVR ABS PNL(IGG+IGM)
RMSF IgG: NEGATIVE
RMSF IgM: 0.33 index (ref 0.00–0.89)

## 2021-09-28 ENCOUNTER — Other Ambulatory Visit: Payer: Self-pay | Admitting: Internal Medicine

## 2021-09-28 DIAGNOSIS — M19041 Primary osteoarthritis, right hand: Secondary | ICD-10-CM

## 2021-11-03 ENCOUNTER — Ambulatory Visit
Admission: EM | Admit: 2021-11-03 | Discharge: 2021-11-03 | Disposition: A | Discharge status: Home / Self Care | Payer: Medicare HMO | Attending: Emergency Medicine | Admitting: Emergency Medicine

## 2021-11-03 DIAGNOSIS — M436 Torticollis: Secondary | ICD-10-CM | POA: Diagnosis not present

## 2021-11-03 MED ORDER — DIAZEPAM 5 MG PO TABS: 5.0000 mg | ORAL_TABLET | Freq: Every evening | ORAL | 0 refills | Status: AC

## 2021-11-03 MED ORDER — BACLOFEN 10 MG PO TABS: 10.0000 mg | ORAL_TABLET | Freq: Three times a day (TID) | ORAL | 0 refills | Status: DC

## 2021-11-03 MED ORDER — PREDNISONE 10 MG (21) PO TBPK: ORAL_TABLET | ORAL | 0 refills | Status: DC

## 2021-11-03 NOTE — ED Provider Notes (Signed)
[facility]    CSN: [id number] Arrival date & time: 11/03/21  1837      History   Chief Complaint Chief Complaint  Patient presents with   Shoulder Pain    HPI Howard Lucas is a 71 y.o. male.   HPI  71 year old male here for evaluation of right shoulder and neck pain.  Patient ports that he has been experiencing pain in his right shoulder and neck for the last 2 months.  He has been taking over-the-counter Tylenol and ibuprofen without any improvement of his pain.  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  He describes feeling a knot on the right side.  He denies any injury.  Patient does have limited range of motion of his neck secondary to the pain.  Past Medical History:  Diagnosis Date   Alcohol abuse    Fatigue    GERD (gastroesophageal reflux disease)    Heart murmur    Hyperlipidemia    Hypertension    Knee pain, left    Memory impairment    Sleeping difficulties     Patient Active Problem List   Diagnosis Date Noted   Stage 3b chronic kidney disease ([place]) 34/19/6222   Acute metabolic encephalopathy 97/98/9211   Encounter for screening colonoscopy    Polyp of ascending colon    Leg pain 03/27/2018   DJD (degenerative joint disease), lumbosacral 03/27/2018   Chronic venous insufficiency 03/27/2018   Varicose veins of both lower extremities with inflammation 03/27/2018   Atypical chest pain 12/17/2016   Dyspnea on exertion 12/17/2016   Essential hypertension 12/17/2016   Hepatomegaly 12/17/2016   Alcohol abuse 12/17/2016   Tobacco abuse 09/07/2016    Past Surgical History:  Procedure Laterality Date   BACK SURGERY     COLONOSCOPY     COLONOSCOPY WITH PROPOFOL N/A 09/14/2019   Procedure: COLONOSCOPY WITH PROPOFOL;  Surgeon: Lucilla Lame, MD;  Location: [facility] ENDOSCOPY;  Service: Endoscopy;  Laterality: N/A;   KNEE SURGERY Bilateral        Home Medications    Prior to Admission medications    Medication Sig Start Date End Date Taking? Authorizing Provider  baclofen (LIORESAL) 10 MG tablet Take 1 tablet (10 mg total) by mouth 3 (three) times daily. 11/03/21  Yes Margarette Canada, NP  diazepam (VALIUM) 5 MG tablet Take 1 tablet (5 mg total) by mouth at bedtime for 5 days. 11/03/21 11/08/21 Yes Margarette Canada, NP  predniSONE (STERAPRED UNI-PAK 21 TAB) 10 MG (21) TBPK tablet Take 6 tablets on day 1, 5 tablets day 2, 4 tablets day 3, 3 tablets day 4, 2 tablets day 5, 1 tablet day 6 11/03/21  Yes Margarette Canada, NP  albuterol (PROVENTIL HFA;VENTOLIN HFA) 108 (90 Base) MCG/ACT inhaler Inhale into the lungs every 6 (six) hours as needed for wheezing or shortness of breath.    [provider]  aspirin 81 MG chewable tablet Chew 81 mg as needed by mouth.    [provider]  atorvastatin (LIPITOR) 80 MG tablet Take 80 mg daily by mouth.    [provider]  B Complex Vitamins (B COMPLEX-B12 PO) Take 1 tablet by mouth daily.    [provider]  budesonide-formoterol (SYMBICORT) 160-4.5 MCG/ACT inhaler Inhale 2 puffs into the lungs 2 (two) times daily.    [provider]  DULoxetine (CYMBALTA) 60 MG capsule Take 60 mg by mouth daily. 01/06/21   [provider]  gabapentin (NEURONTIN) 100 MG capsule Take 100 mg by mouth 3 (three) times daily. 01/12/21   [provider]  hydrochlorothiazide (MICROZIDE) 12.5 MG capsule Take 12.5 mg by mouth daily. 03/30/21   [provider]  hydrocortisone cream 0.5 % Apply 1 application. topically as needed for itching (scalp itching).    [provider]  lisinopril (PRINIVIL,ZESTRIL) 20 MG tablet Take 20 mg daily by mouth.    [provider]  nitroGLYCERIN (NITROSTAT) 0.4 MG SL tablet Place 1 tablet (0.4 mg total) every 5 (five) minutes as needed under the tongue for chest pain. 12/15/16 03/15/17  End, Harrell Gave, MD  omeprazole (PRILOSEC) 40 MG capsule Take 40 mg by mouth 2 (two) times daily.  04/20/21   [provider]  senna (SENOKOT) 8.6 MG TABS tablet Take 2 tablets by mouth daily. 04/20/21   [provider]  triamcinolone cream (KENALOG) 0.1 % Apply 1 Application topically 2 (two) times daily. Apply for 2 weeks. May use on face 07/24/21   Melynda Ripple, MD    Family History Family History  Problem Relation Age of Onset   Other Mother        "blood clot in the heart"   Diabetes Father    Cancer Sister    Cancer Brother     Social History Social History   Tobacco Use   Smoking status: Every Day    Packs/day: 2.00    Years: 51.00    Total pack years: 102.00    Types: Cigarettes   Smokeless tobacco: Never  Vaping Use   Vaping Use: Never used  Substance Use Topics   Alcohol use: Yes    Alcohol/week: 70.0 standard drinks of alcohol    Types: 70 Cans of beer per week    Comment: 8/9 - pt reports he has cut back to about 6 beers on Tues and Fri)   Drug use: No     Allergies   Patient has no known allergies.   Review of Systems Review of Systems  Constitutional:  Negative for activity change and fever.  Musculoskeletal:  Positive for myalgias and neck pain.  Neurological:  Negative for weakness and numbness.  Hematological: Negative.   Psychiatric/Behavioral: Negative.       Physical Exam Triage Vital Signs ED Triage Vitals  Enc Vitals Group     BP 11/03/21 1919 (!) 111/57     Pulse Rate 11/03/21 1919 80     Resp --      Temp 11/03/21 1919 97.8 F (36.6 C)     Temp Source 11/03/21 1919 Temporal     SpO2 11/03/21 1919 97 %     Weight --      Height --      Head Circumference --      Peak Flow --      Pain Score 11/03/21 1920 8     Pain Loc --      Pain Edu? --      Excl. in [place]? --    No data found.  Updated Vital Signs BP (!) 111/57 (BP Location: Left Arm)   Pulse 80   Temp 97.8 F (36.6 C) (Temporal)   SpO2 97%   Visual Acuity Right Eye Distance:   Left Eye Distance:   Bilateral Distance:    Right Eye Near:    Left Eye Near:    Bilateral Near:     Physical Exam Vitals and nursing note reviewed.  Constitutional:  Appearance: Normal appearance. He is not ill-appearing.  HENT:     Head: Normocephalic and atraumatic.  Musculoskeletal:        General: Tenderness present. No swelling, deformity or signs of injury.  Skin:    General: Skin is warm and dry.     Capillary Refill: Capillary refill takes less than 2 seconds.     Findings: No bruising or erythema.  Neurological:     General: No focal deficit present.     Mental Status: He is alert and oriented to person, place, and time.  Psychiatric:        Mood and Affect: Mood normal.        Behavior: Behavior normal.        Thought Content: Thought content normal.        Judgment: Judgment normal.      UC Treatments / Results  Labs (all labs ordered are listed, but only abnormal results are displayed) Labs Reviewed - No data to display  EKG   Radiology No results found.  Procedures Procedures (including critical care time)  Medications Ordered in UC Medications - No data to display  Initial Impression / Assessment and Plan / UC Course  I have reviewed the triage vital signs and the nursing notes.  Pertinent labs & imaging results that were available during my care of the patient were reviewed by me and considered in my medical decision making (see chart for details).   Patient is a nontoxic-appearing 71 year old male here for evaluation of right shoulder and neck pain that is been going on for the past 2 months and not responding to over-the-counter NSAIDs or Tylenol.  He describes feeling a knot in the right side of his neck.  On exam patient's head is cocked a little bit to the right with his shoulder drawn close to his head.  He has limited range of motion with left right rotation but he has normal flexion and extension of his neck.  There is no midline spinous tenderness or step-off when palpating the cervical spine.   There is significant muscle tension in the cervical paraspinous region bilaterally though the right is greater than the left.  He is moving all extremities equally.  His exam is consistent with torticollis.  He has history of stage III kidney disease so he should not be taking NSAIDs so I will start him on prednisone that he start tomorrow morning to help with inflammation as well as baclofen 10 mg that he can take 3 times a day.  I will also give low-dose Valium tablet that he can take at bedtime only to help reduce spasm.  PDMP reviewed and there is no open prescription for narcotics or benzodiazepine at present.   Final Clinical Impressions(s) / UC Diagnoses   Final diagnoses:  Torticollis     Discharge Instructions      Starting tomorrow morning at breakfast take the prednisone according to the package instructions.  This will help you with inflammation.  Use the baclofen 10 mg every 8 hours as needed for muscle tension and spasm.  Take the Valium at bedtime to help with sleep and muscle spasm.  You can apply moist heat to your neck for 20 minutes at a time 2-3 times a day to help improve blood flow and aid in relaxation of the muscle.  Massage therapy can also be very effective.  If your symptoms continue, or they worsen, either return for reevaluation or see your primary care provider.  ED Prescriptions     Medication Sig Dispense Auth. Provider   baclofen (LIORESAL) 10 MG tablet Take 1 tablet (10 mg total) by mouth 3 (three) times daily. 15 each Margarette Canada, NP   diazepam (VALIUM) 5 MG tablet Take 1 tablet (5 mg total) by mouth at bedtime for 5 days. 3 tablet Margarette Canada, NP   predniSONE (STERAPRED UNI-PAK 21 TAB) 10 MG (21) TBPK tablet Take 6 tablets on day 1, 5 tablets day 2, 4 tablets day 3, 3 tablets day 4, 2 tablets day 5, 1 tablet day 6 21 tablet Margarette Canada, NP      I have reviewed the PDMP during this encounter.   Margarette Canada, NP 11/03/21 1949

## 2021-11-03 NOTE — ED Triage Notes (Signed)
Patient presents to UC for shoulder and neck pain x 2 months. Treating pain with tylenol and ibuprofen with no relief. 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

## 2021-11-03 NOTE — Discharge Instructions (Signed)
Starting tomorrow morning at breakfast take the prednisone according to the package instructions.  This will help you with inflammation.  Use the baclofen 10 mg every 8 hours as needed for muscle tension and spasm.  Take the Valium at bedtime to help with sleep and muscle spasm.  You can apply moist heat to your neck for 20 minutes at a time 2-3 times a day to help improve blood flow and aid in relaxation of the muscle.  Massage therapy can also be very effective.  If your symptoms continue, or they worsen, either return for reevaluation or see your primary care provider.

## 2022-01-30 ENCOUNTER — Ambulatory Visit (INDEPENDENT_AMBULATORY_CARE_PROVIDER_SITE_OTHER): Payer: Medicare HMO

## 2022-01-30 ENCOUNTER — Other Ambulatory Visit: Payer: Self-pay

## 2022-01-30 ENCOUNTER — Ambulatory Visit
Admission: EM | Admit: 2022-01-30 | Discharge: 2022-01-30 | Disposition: A | Discharge status: Home / Self Care | Payer: Medicare HMO | Attending: Emergency Medicine | Admitting: Emergency Medicine

## 2022-01-30 DIAGNOSIS — R059 Cough, unspecified: Secondary | ICD-10-CM | POA: Diagnosis not present

## 2022-01-30 DIAGNOSIS — J069 Acute upper respiratory infection, unspecified: Secondary | ICD-10-CM | POA: Diagnosis not present

## 2022-01-30 DIAGNOSIS — R0989 Other specified symptoms and signs involving the circulatory and respiratory systems: Secondary | ICD-10-CM

## 2022-01-30 MED ORDER — LEVOFLOXACIN 500 MG PO TABS: 500.0000 mg | ORAL_TABLET | Freq: Every day | ORAL | 0 refills | Status: DC

## 2022-01-30 MED ORDER — BENZONATATE 100 MG PO CAPS: 200.0000 mg | ORAL_CAPSULE | Freq: Three times a day (TID) | ORAL | 0 refills | Status: DC

## 2022-01-30 MED ORDER — PREDNISONE 20 MG PO TABS: 60.0000 mg | ORAL_TABLET | Freq: Every day | ORAL | 0 refills | Status: AC

## 2022-01-30 MED ORDER — IPRATROPIUM BROMIDE 0.06 % NA SOLN: 2.0000 | Freq: Four times a day (QID) | NASAL | 12 refills | Status: DC

## 2022-01-30 MED ORDER — PROMETHAZINE-DM 6.25-15 MG/5ML PO SYRP: 5.0000 mL | ORAL_SOLUTION | Freq: Four times a day (QID) | ORAL | 0 refills | Status: DC | PRN

## 2022-01-30 NOTE — Discharge Instructions (Signed)
Use the albuterol inhaler/nebulizer every 4-6 hours as needed for shortness of breath, wheezing, and cough.  Take the prednisone according to the package instructions to help with pulmonary inflammation.  Use the Tessalon Perles every 8 hours for your cough.  Taken with a small sip of water.  They may give you some numbness to the base of your tongue or metallic taste in her mouth, this is normal.  They are designed to calm down the cough reflex.  Use the Promethazine DM cough syrup at bedtime as will make you drowsy.  You may take 1 teaspoon (5 mL) every 6 hours.  Levaquin daily for 7 days.  Use the Atrovent nasal spray, 2 squirts in each nostril every 6 hours as needed for nasal congestion.  Return for reevaluation for new or worsening symptoms.

## 2022-01-30 NOTE — ED Triage Notes (Addendum)
Pt reports he has had cough and nasal drainage for the past 4 weeks and just can't shake it. Coughing up green phlegm

## 2022-01-30 NOTE — ED Provider Notes (Signed)
[facility]    CSN: [id number] Arrival date & time: 01/30/22  1321      History   Chief Complaint Chief Complaint  Patient presents with   Cough    HPI Howard Lucas is a 71 y.o. male.   HPI  71 year old male here for evaluation of respiratory complaints.  Patient has a significant past medical history to include tobacco abuse, hypertension, history of chronic kidney disease, and tobacco abuse and he is presenting for evaluation of 4 weeks worth of nasal congestion with green nasal discharge, productive cough for green sputum, shortness breath, and wheezing.  He has not had any fevers.  He has been using his inhalers at home without any improvement of his symptoms.  He states that he got into a coughing fit last night and he could not catch his breath which caused a bit of a panic attack.  Patient is not in any acute distress in the exam room and he is able to speak in full sentences without any dyspnea or tachypnea.  Past Medical History:  Diagnosis Date   Alcohol abuse    Fatigue    GERD (gastroesophageal reflux disease)    Heart murmur    Hyperlipidemia    Hypertension    Knee pain, left    Memory impairment    Sleeping difficulties     Patient Active Problem List   Diagnosis Date Noted   Stage 3b chronic kidney disease ([place]) 50/10/3816   Acute metabolic encephalopathy 29/93/7169   Encounter for screening colonoscopy    Polyp of ascending colon    Leg pain 03/27/2018   DJD (degenerative joint disease), lumbosacral 03/27/2018   Chronic venous insufficiency 03/27/2018   Varicose veins of both lower extremities with inflammation 03/27/2018   Atypical chest pain 12/17/2016   Dyspnea on exertion 12/17/2016   Essential hypertension 12/17/2016   Hepatomegaly 12/17/2016   Alcohol abuse 12/17/2016   Tobacco abuse 09/07/2016    Past Surgical History:  Procedure Laterality Date   BACK SURGERY     COLONOSCOPY     COLONOSCOPY WITH PROPOFOL N/A 09/14/2019    Procedure: COLONOSCOPY WITH PROPOFOL;  Surgeon: Lucilla Lame, MD;  Location: [facility] ENDOSCOPY;  Service: Endoscopy;  Laterality: N/A;   KNEE SURGERY Bilateral        Home Medications    Prior to Admission medications   Medication Sig Start Date End Date Taking? Authorizing Provider  benzonatate (TESSALON) 100 MG capsule Take 2 capsules (200 mg total) by mouth every 8 (eight) hours. 01/30/22  Yes Margarette Canada, NP  ipratropium (ATROVENT) 0.06 % nasal spray Place 2 sprays into both nostrils 4 (four) times daily. 01/30/22  Yes Margarette Canada, NP  levofloxacin (LEVAQUIN) 500 MG tablet Take 1 tablet (500 mg total) by mouth daily. 01/30/22  Yes Margarette Canada, NP  predniSONE (DELTASONE) 20 MG tablet Take 3 tablets (60 mg total) by mouth daily with breakfast for 5 days. 3 tablets daily for 5 days. 01/30/22 02/04/22 Yes Margarette Canada, NP  promethazine-dextromethorphan (PROMETHAZINE-DM) 6.25-15 MG/5ML syrup Take 5 mLs by mouth 4 (four) times daily as needed. 01/30/22  Yes Margarette Canada, NP  albuterol (PROVENTIL HFA;VENTOLIN HFA) 108 (90 Base) MCG/ACT inhaler Inhale into the lungs every 6 (six) hours as needed for wheezing or shortness of breath.    [provider]  aspirin 81 MG chewable tablet Chew 81 mg as needed by mouth.    [provider]  atorvastatin (LIPITOR) 80 MG tablet Take 80 mg daily  by mouth.    [provider]  budesonide-formoterol (SYMBICORT) 160-4.5 MCG/ACT inhaler Inhale 2 puffs into the lungs 2 (two) times daily.    [provider]  DULoxetine (CYMBALTA) 60 MG capsule Take 60 mg by mouth daily. 01/06/21   [provider]  gabapentin (NEURONTIN) 100 MG capsule Take 100 mg by mouth 3 (three) times daily. 01/12/21   [provider]  hydrochlorothiazide (MICROZIDE) 12.5 MG capsule Take 12.5 mg by mouth daily. 03/30/21   [provider]  hydrocortisone cream 0.5 % Apply 1 application. topically as needed for itching (scalp itching).     [provider]  lisinopril (PRINIVIL,ZESTRIL) 20 MG tablet Take 20 mg daily by mouth.    [provider]  nitroGLYCERIN (NITROSTAT) 0.4 MG SL tablet Place 1 tablet (0.4 mg total) every 5 (five) minutes as needed under the tongue for chest pain. 12/15/16 03/15/17  End, Harrell Gave, MD  omeprazole (PRILOSEC) 40 MG capsule Take 40 mg by mouth 2 (two) times daily. 04/20/21   [provider]  senna (SENOKOT) 8.6 MG TABS tablet Take 2 tablets by mouth daily. 04/20/21   [provider]    Family History Family History  Problem Relation Age of Onset   Other Mother        "blood clot in the heart"   Diabetes Father    Cancer Sister    Cancer Brother     Social History Social History   Tobacco Use   Smoking status: Every Day    Packs/day: 2.00    Years: 51.00    Total pack years: 102.00    Types: Cigarettes   Smokeless tobacco: Never  Vaping Use   Vaping Use: Never used  Substance Use Topics   Alcohol use: Not Currently    Alcohol/week: 70.0 standard drinks of alcohol    Types: 70 Cans of beer per week    Comment: 8/9 - pt reports he has cut back to about 6 beers on Tues and Fri)   Drug use: No     Allergies   Patient has no known allergies.   Review of Systems Review of Systems  Constitutional:  Negative for fever.  HENT:  Positive for congestion and rhinorrhea. Negative for sore throat.   Respiratory:  Positive for cough, shortness of breath and wheezing.      Physical Exam Triage Vital Signs ED Triage Vitals  Enc Vitals Group     BP 01/30/22 1427 124/76     Pulse Rate 01/30/22 1427 89     Resp 01/30/22 1427 19     Temp 01/30/22 1427 98.2 F (36.8 C)     Temp Source 01/30/22 1427 Oral     SpO2 01/30/22 1427 98 %     Weight 01/30/22 1427 178 lb (80.7 kg)     Height 01/30/22 1427 '5\' 8"'$  (1.727 m)     Head Circumference --      Peak Flow --      Pain Score 01/30/22 1428 0     Pain Loc --      Pain Edu? --      Excl. in [place]? --     No data found.  Updated Vital Signs BP 124/76 (BP Location: Left Arm)   Pulse 89   Temp 98.2 F (36.8 C) (Oral)   Resp 19   Ht '5\' 8"'$  (1.727 m)   Wt 178 lb (80.7 kg)   SpO2 98%   BMI 27.06 kg/m   Visual  Acuity Right Eye Distance:   Left Eye Distance:   Bilateral Distance:    Right Eye Near:   Left Eye Near:    Bilateral Near:     Physical Exam Vitals and nursing note reviewed.  Constitutional:      Appearance: Normal appearance. He is not ill-appearing.  HENT:     Head: Normocephalic and atraumatic.     Right Ear: Tympanic membrane, ear canal and external ear normal. There is no impacted cerumen.     Left Ear: Tympanic membrane, ear canal and external ear normal. There is no impacted cerumen.     Nose: Congestion and rhinorrhea present.     Comments: Patient comes is erythematous and edematous with milky white discharge in both nares.    Mouth/Throat:     Mouth: Mucous membranes are moist.     Pharynx: Oropharynx is clear. No posterior oropharyngeal erythema.  Cardiovascular:     Rate and Rhythm: Normal rate and regular rhythm.     Pulses: Normal pulses.     Heart sounds: Normal heart sounds. No murmur heard.    No friction rub. No gallop.  Pulmonary:     Effort: Pulmonary effort is normal.     Breath sounds: Wheezing and rhonchi present. No rales.     Comments: Wheezes and rhonchi diffusely in all lung fields. Musculoskeletal:     Cervical back: Normal range of motion and neck supple.  Skin:    General: Skin is warm and dry.     Capillary Refill: Capillary refill takes less than 2 seconds.     Findings: No erythema or rash.  Neurological:     General: No focal deficit present.     Mental Status: He is alert and oriented to person, place, and time.  Psychiatric:        Mood and Affect: Mood normal.        Behavior: Behavior normal.        Thought Content: Thought content normal.        Judgment: Judgment normal.      UC Treatments / Results   Labs (all labs ordered are listed, but only abnormal results are displayed) Labs Reviewed - No data to display  EKG   Radiology DG Chest 2 View  Result Date: 01/30/2022 CLINICAL DATA:  Cough, chest congestion EXAM: CHEST - 2 VIEW COMPARISON:  Chest radiograph done on 05/13/2021 and CT chest done on 09/20/2019 FINDINGS: Cardiac size is within normal limits. There are no signs of pulmonary edema or focal pulmonary consolidation. There is mild peribronchial thickening. There is no pleural effusion or pneumothorax. Scattered arterial calcifications are seen. IMPRESSION: Mild peribronchial thickening suggests bronchitis. There is no focal pulmonary consolidation. There is no pleural effusion or pneumothorax. Electronically Signed   By: Elmer Picker M.D.   On: 01/30/2022 14:45    Procedures Procedures (including critical care time)  Medications Ordered in UC Medications - No data to display  Initial Impression / Assessment and Plan / UC Course  I have reviewed the triage vital signs and the nursing notes.  Pertinent labs & imaging results that were available during my care of the patient were reviewed by me and considered in my medical decision making (see chart for details).   Patient is a pleasant, nontoxic-appearing 71 year old male presenting for evaluation of respiratory complaints have been going for the past 4 weeks.  Patient's largest complaint is his cough which is occasionally productive for green sputum.  He states he will  get into coughing fits that he cannot stop and he cannot catch his breath which induces a bit of a panic.  He said most recently this happened last night.  He has been experiencing nasal congestion with green nasal discharge as well.  His exam does reveal inflammation of his upper respiratory tree and he is got wheezes and rhonchi in all of his lung fields.  Chest x-ray was collected at triage and has been read as bronchitis.  Patient is a longtime smoker and  has an underlying COPD.  I will treat him for COPD exacerbation addition to the bronchitis and put him on a 7-day course of 500 mg of Levaquin daily.  Also burst dose of 60 mg prednisone daily x 5 days to help with pulmonary inflammation.  He should continue to use his albuterol inhaler as needed for shortness of breath and wheezing.  I have prescribed Tessalon Perles and Promethazine DM cough syrup to help with the symptoms as well.  He has significant nasal congestion and I will also prescribe Atrovent nasal spray that he can use 4 times a day.  ER and return precautions reviewed.   Final Clinical Impressions(s) / UC Diagnoses   Final diagnoses:  Upper respiratory tract infection, unspecified type     Discharge Instructions      Use the albuterol inhaler/nebulizer every 4-6 hours as needed for shortness of breath, wheezing, and cough.  Take the prednisone according to the package instructions to help with pulmonary inflammation.  Use the Tessalon Perles every 8 hours for your cough.  Taken with a small sip of water.  They may give you some numbness to the base of your tongue or metallic taste in her mouth, this is normal.  They are designed to calm down the cough reflex.  Use the Promethazine DM cough syrup at bedtime as will make you drowsy.  You may take 1 teaspoon (5 mL) every 6 hours.  Levaquin daily for 7 days.  Use the Atrovent nasal spray, 2 squirts in each nostril every 6 hours as needed for nasal congestion.  Return for reevaluation for new or worsening symptoms.      ED Prescriptions     Medication Sig Dispense Auth. Provider   levofloxacin (LEVAQUIN) 500 MG tablet Take 1 tablet (500 mg total) by mouth daily. 7 tablet Margarette Canada, NP   predniSONE (DELTASONE) 20 MG tablet Take 3 tablets (60 mg total) by mouth daily with breakfast for 5 days. 3 tablets daily for 5 days. 15 tablet Margarette Canada, NP   benzonatate (TESSALON) 100 MG capsule Take 2 capsules (200 mg total) by  mouth every 8 (eight) hours. 21 capsule Margarette Canada, NP   ipratropium (ATROVENT) 0.06 % nasal spray Place 2 sprays into both nostrils 4 (four) times daily. 15 mL Margarette Canada, NP   promethazine-dextromethorphan (PROMETHAZINE-DM) 6.25-15 MG/5ML syrup Take 5 mLs by mouth 4 (four) times daily as needed. 118 mL Margarette Canada, NP      PDMP not reviewed this encounter.   Margarette Canada, NP 01/30/22 [phone]

## 2022-02-01 ENCOUNTER — Telehealth: Payer: Self-pay | Admitting: Physician Assistant

## 2022-02-01 MED ORDER — AZITHROMYCIN 250 MG PO TABS
250.0000 mg | ORAL_TABLET | Freq: Every day | ORAL | 0 refills | Status: DC
Start: 2022-02-01 — End: 2023-06-29

## 2022-02-01 MED ORDER — AZITHROMYCIN 250 MG PO TABS
250.0000 mg | ORAL_TABLET | Freq: Every day | ORAL | 0 refills | Status: DC
Start: 2022-02-01 — End: 2022-02-01

## 2022-02-01 NOTE — Telephone Encounter (Signed)
Patient prescribed Levaquin 2 days ago for upper respiratory infection and COPD.  Reports tingling and confusion since taking this medication.  Pharmacist advised him to discontinue the medicine and start something new.  Advised to discontinue the Levaquin.  Will start him on azithromycin.  If confusion does not improve in the next 24 hours or it worsens he has been advised to go to the emergency department.

## 2022-02-01 NOTE — Addendum Note (Signed)
Addended by: Laurene Footman B on: 02/01/2022 05:47 PM   Modules accepted: Orders

## 2023-01-01 DIAGNOSIS — I129 Hypertensive chronic kidney disease with stage 1 through stage 4 chronic kidney disease, or unspecified chronic kidney disease: Secondary | ICD-10-CM | POA: Insufficient documentation

## 2023-01-01 DIAGNOSIS — N189 Chronic kidney disease, unspecified: Secondary | ICD-10-CM | POA: Diagnosis not present

## 2023-01-01 DIAGNOSIS — K5900 Constipation, unspecified: Secondary | ICD-10-CM | POA: Insufficient documentation

## 2023-01-02 ENCOUNTER — Emergency Department: Payer: Medicare HMO

## 2023-01-02 ENCOUNTER — Other Ambulatory Visit: Payer: Self-pay

## 2023-01-02 ENCOUNTER — Emergency Department
Admission: EM | Admit: 2023-01-02 | Discharge: 2023-01-02 | Disposition: A | Discharge status: Home / Self Care | Payer: Medicare HMO | Attending: Emergency Medicine | Admitting: Emergency Medicine

## 2023-01-02 DIAGNOSIS — K5900 Constipation, unspecified: Secondary | ICD-10-CM

## 2023-01-02 MED ORDER — POLYETHYLENE GLYCOL 3350 17 G PO PACK
34.0000 g | PACK | Freq: Once | ORAL | Status: AC
Start: 2023-01-02 — End: 2023-01-02
  Administered 2023-01-02: 34 g via ORAL
  Filled 2023-01-02: qty 2

## 2023-01-02 NOTE — ED Triage Notes (Signed)
Pt to ED via EMS from home, pt reports being constipated since 3pm today. Pt reports last good BM was yesterday, pt has hx hemorrhoids. Denies any bleeding or abd pain.

## 2023-01-02 NOTE — Discharge Instructions (Signed)
Use miralax, 1-2 capfuls per dose, 1-2 times per day  Use Tylenol for pain and fevers.  Up to 1000 mg per dose, up to 4 times per day.  Do not take more than 4000 mg of Tylenol/acetaminophen within 24 hours..  Drink plenty of water!

## 2023-01-02 NOTE — ED Provider Notes (Signed)
[facility] Provider Note    Event Date/Time   First MD Initiated Contact with Patient 01/02/23 [id number]     (approximate)   History   Constipation   HPI  Howard Lucas is a 72 y.o. male who presents to the ED for evaluation of Constipation   Review of medical DC summary from last year.  History of HTN, CKD admitted for metabolic encephalopathy.  Likely polypharmacy.  Patient presents for evaluation of a few hours of constipation.  Reports a bowel movement yesterday that was somewhat firm.  Has been trying to pass a bowel movement since 3 PM but unable to do so.  Reports drinking some coffee prior to arrival, "and I never drink coffee.  "  No abdominal pain, emesis, difficulty voiding.  No chest pain, recent illnesses, dyspnea.  No other concerns   Physical Exam   Triage Vital Signs: ED Triage Vitals  Encounter Vitals Group     BP 01/02/23 0003 137/78     Systolic BP Percentile --      Diastolic BP Percentile --      Pulse Rate 01/02/23 0003 (!) 135     Resp 01/02/23 0003 20     Temp 01/02/23 0003 98.5 F (36.9 C)     Temp Source 01/02/23 0003 Oral     SpO2 01/02/23 0003 100 %     Weight 01/02/23 0002 173 lb (78.5 kg)     Height 01/02/23 0002 5\' 8"  (1.727 m)     Head Circumference --      Peak Flow --      Pain Score 01/02/23 0002 0     Pain Loc --      Pain Education --      Exclude from Growth Chart --     Most recent vital signs: Vitals:   01/02/23 0003 01/02/23 0130  BP: 137/78 130/61  Pulse: (!) 135 91  Resp: 20 20  Temp: 98.5 F (36.9 C)   SpO2: 100%     General: Awake, no distress.  CV:  Good peripheral perfusion.  Resp:  Normal effort.  Abd:  No distention.  Soft and benign MSK:  No deformity noted.  Neuro:  No focal deficits appreciated. Other:  Chaperoned rectal exam with impacted hard stool without signs of prolapse or hemorrhoids or bleeding   ED Results / Procedures / Treatments   Labs (all labs ordered are  listed, but only abnormal results are displayed) Labs Reviewed - No data to display  EKG Sinus tachycardia with a rate of 137 bpm.  Normal axis and intervals.  Nonspecific changes without STEMI rate related  RADIOLOGY KUB interpreted by me with nonobstructive pattern  Official radiology report(s): DG Abdomen 1 View  Result Date: 01/02/2023 CLINICAL DATA:  Constipation since 3 p.m. EXAM: ABDOMEN - 1 VIEW COMPARISON:  CT 05/13/2021 FINDINGS: Postoperative changes with posterior fixation and bone graft of the lower lumbar spine. Scattered gas and stool throughout the colon. No small or large bowel distention. No radiopaque stones. Degenerative changes in the spine and hips. Lung bases are clear. IMPRESSION: Normal nonobstructive bowel gas pattern. Scattered stool in the colon. Electronically Signed   By: Burman Nieves M.D.   On: 01/02/2023 01:22    PROCEDURES and INTERVENTIONS:  .Fecal disimpaction  Date/Time: 01/02/2023 2:24 AM  Performed by: Delton Prairie, MD Authorized by: Delton Prairie, MD  Risks and benefits: risks, benefits and alternatives were discussed Consent given by: patient Patient tolerance: patient  tolerated the procedure well with no immediate complications     Medications - No data to display   IMPRESSION / MDM / ASSESSMENT AND PLAN / ED COURSE  I reviewed the triage vital signs and the nursing notes.  Differential diagnosis includes, but is not limited to, SBO, impacted stool, slow transit constipation, medication side effect  Patient presents with evidence of constipation and impacted stool requiring manual disimpaction and subsequently suitable for outpatient management.  Recommend MiraLAX.  KUB without obstructive features and has a benign abdomen.  Triage tachycardia is noted this self resolves.  Possibly due to discomfort in the setting of caffeine intake.   Clinical Course as of 01/02/23 5409  Wynelle Link Jan 02, 2023  0141 Disimpaction performed [DS]     Clinical Course User Index [DS] Delton Prairie, MD     FINAL CLINICAL IMPRESSION(S) / ED DIAGNOSES   Final diagnoses:  Constipation, unspecified constipation type     Rx / DC Orders   ED Discharge Orders     None        Note:  This document was prepared using Dragon voice recognition software and may include unintentional dictation errors.   Delton Prairie, MD 01/02/23 [phone]

## 2023-03-25 ENCOUNTER — Other Ambulatory Visit: Payer: Self-pay | Admitting: Family Medicine

## 2023-03-25 DIAGNOSIS — Z87891 Personal history of nicotine dependence: Secondary | ICD-10-CM

## 2023-04-19 ENCOUNTER — Ambulatory Visit
Admission: EM | Admit: 2023-04-19 | Discharge: 2023-04-19 | Disposition: A | Discharge status: Home / Self Care | Attending: Emergency Medicine | Admitting: Emergency Medicine

## 2023-04-19 ENCOUNTER — Ambulatory Visit (INDEPENDENT_AMBULATORY_CARE_PROVIDER_SITE_OTHER)

## 2023-04-19 DIAGNOSIS — M25521 Pain in right elbow: Secondary | ICD-10-CM | POA: Diagnosis present

## 2023-04-19 DIAGNOSIS — M19021 Primary osteoarthritis, right elbow: Secondary | ICD-10-CM

## 2023-04-19 LAB — BASIC METABOLIC PANEL
Anion gap: 10 (ref 5–15)
BUN: 19 mg/dL (ref 8–23)
CO2: 26 mmol/L (ref 22–32)
Calcium: 8.7 mg/dL — ABNORMAL LOW (ref 8.9–10.3)
Chloride: 99 mmol/L (ref 98–111)
Creatinine, Ser: 1.42 mg/dL — ABNORMAL HIGH (ref 0.61–1.24)
GFR, Estimated: 52 mL/min — ABNORMAL LOW (ref >60)
Glucose, Bld: 151 mg/dL — ABNORMAL HIGH (ref 70–99)
Potassium: 3.6 mmol/L (ref 3.5–5.1)
Sodium: 135 mmol/L (ref 135–145)

## 2023-04-19 MED ORDER — METHYLPREDNISOLONE 4 MG PO TBPK: ORAL_TABLET | ORAL | 0 refills | Status: DC

## 2023-04-19 MED ORDER — DEXAMETHASONE SODIUM PHOSPHATE 10 MG/ML IJ SOLN
10.0000 mg | Freq: Once | INTRAMUSCULAR | Status: AC
  Administered 2023-04-19: 10 mg via INTRAMUSCULAR

## 2023-04-19 NOTE — ED Provider Notes (Addendum)
 [facility]    CSN: [id number] Arrival date & time: 04/19/23  1636      History   Chief Complaint Chief Complaint  Patient presents with   facial tingling   Arm Pain    HPI Howard Lucas is a 73 y.o. male.   HPI  73 year old male with past medical history significant for hypertension, alcohol abuse, chronic left knee pain, memory impairment, sleeping difficulties, hyperlipidemia, fatigue, GERD, heart murmur presents for evaluation of 2 separate complaints.  The first complaint is tingling to the left side of his head that is been going on for the last month.  This is occasionally associated with a headache but not at present.  No numbness, tingling, or weakness in any of his extremities.  Patient is a complaint is pain in his right arm that is located at the elbow.  No known fall or injury.  Patient reports that he has been unable to get sleep the last 3 nights due to the pain in his elbow.  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/27/2018   Atypical chest pain 12/17/2016   Dyspnea on exertion 12/17/2016   Essential hypertension 12/17/2016   Hepatomegaly 12/17/2016   Alcohol abuse 12/17/2016   Tobacco abuse 09/07/2016    Past Surgical History:  Procedure Laterality Date   BACK SURGERY     COLONOSCOPY     COLONOSCOPY WITH PROPOFOL N/A 09/14/2019   Procedure: COLONOSCOPY  WITH PROPOFOL;  Surgeon: Midge Minium, MD;  Location: [facility] ENDOSCOPY;  Service: Endoscopy;  Laterality: N/A;   KNEE SURGERY Bilateral        Home Medications    Prior to Admission medications   Medication Sig Start Date End Date Taking? Authorizing Provider  aspirin 81 MG chewable tablet Chew 81 mg as needed by mouth.   Yes [provider]  atorvastatin (LIPITOR) 80 MG tablet Take 80 mg daily by mouth.   Yes [provider]  gabapentin (NEURONTIN) 100 MG capsule Take 100 mg by mouth 3 (three) times daily. 01/12/21  Yes [provider]  hydrochlorothiazide (MICROZIDE) 12.5 MG capsule Take 12.5 mg by mouth daily. 03/30/21  Yes [provider]  methylPREDNISolone (MEDROL DOSEPAK) 4 MG TBPK tablet Take according to the package insert. 04/19/23  Yes Becky Augusta, NP  omeprazole (PRILOSEC) 40 MG capsule Take 40 mg by mouth 2 (two) times daily. 04/20/21  Yes [provider]  albuterol (PROVENTIL HFA;VENTOLIN HFA) 108 (90 Base) MCG/ACT inhaler Inhale into the lungs every 6 (six) hours as needed for wheezing or shortness of breath.    [provider]  azithromycin (ZITHROMAX) 250 MG tablet Take 1 tablet (250 mg total) by mouth daily. Take first 2 tablets together, then 1 every day until finished. 02/01/22   Shirlee Latch, PA-C  benzonatate (TESSALON) 100 MG capsule  Take 2 capsules (200 mg total) by mouth every 8 (eight) hours. 01/30/22   Becky Augusta, NP  budesonide-formoterol [facility]) 160-4.5 MCG/ACT inhaler Inhale 2 puffs into the lungs 2 (two) times daily.    [provider]  DULoxetine (CYMBALTA) 60 MG capsule Take 60 mg by mouth daily. 01/06/21   [provider]  hydrocortisone cream 0.5 % Apply 1 application. topically as needed for itching (scalp itching).    [provider]  ipratropium (ATROVENT) 0.06 % nasal spray Place 2 sprays into both nostrils 4 (four) times daily. 01/30/22   Becky Augusta, NP  levofloxacin  (LEVAQUIN) 500 MG tablet Take 1 tablet (500 mg total) by mouth daily. 01/30/22   Becky Augusta, NP  lisinopril (PRINIVIL,ZESTRIL) 20 MG tablet Take 20 mg daily by mouth.    [provider]  nitroGLYCERIN (NITROSTAT) 0.4 MG SL tablet Place 1 tablet (0.4 mg total) every 5 (five) minutes as needed under the tongue for chest pain. 12/15/16 03/15/17  End, Cristal Deer, MD  promethazine-dextromethorphan (PROMETHAZINE-DM) 6.25-15 MG/5ML syrup Take 5 mLs by mouth 4 (four) times daily as needed. 01/30/22   Becky Augusta, NP  senna (SENOKOT) 8.6 MG TABS tablet Take 2 tablets by mouth daily. 04/20/21   [provider]    Family History Family History  Problem Relation Age of Onset   Other Mother        "blood clot in the heart"   Diabetes Father    Cancer Sister    Cancer Brother     Social History Social History   Tobacco Use   Smoking status: Every Day    Current packs/day: 2.00    Average packs/day: 2.0 packs/day for 51.0 years (102.0 ttl pk-yrs)    Types: Cigarettes   Smokeless tobacco: Never  Vaping Use   Vaping status: Never Used  Substance Use Topics   Alcohol use: Not Currently    Alcohol/week: 70.0 standard drinks of alcohol    Types: 70 Cans of beer per week    Comment: 8/9 - pt reports he has cut back to about 6 beers on Tues and Fri)   Drug use: No     Allergies   Patient has no known allergies.   Review of Systems Review of Systems  Musculoskeletal:  Positive for arthralgias. Negative for joint swelling.  Skin:  Negative for color change.  Neurological:  Positive for numbness. Negative for weakness.     Physical Exam Triage Vital Signs ED Triage Vitals  Encounter Vitals Group     BP 04/19/23 1645 (!) 166/77     Systolic BP Percentile --      Diastolic BP Percentile --      Pulse Rate 04/19/23 1645 86     Resp 04/19/23 1645 17     Temp 04/19/23 1645 98.3 F (36.8 C)     Temp src --      SpO2 04/19/23 1645 96 %     Weight --      Height --       Head Circumference --      Peak Flow --      Pain Score 04/19/23 1643 10     Pain Loc --      Pain Education --      Exclude from Growth Chart --    No data found.  Updated Vital Signs BP (!) 148/75 (BP Location: Left Arm)   Pulse 86   Temp 98.3 F (36.8 C)   Resp 17  SpO2 96%   Visual Acuity Right Eye Distance:   Left Eye Distance:   Bilateral Distance:    Right Eye Near:   Left Eye Near:    Bilateral Near:     Physical Exam Vitals and nursing note reviewed.  Constitutional:      Appearance: Normal appearance. He is not ill-appearing.  HENT:     Head: Normocephalic and atraumatic.  Eyes:     General: No scleral icterus.    Extraocular Movements: Extraocular movements intact.     Conjunctiva/sclera: Conjunctivae normal.     Pupils: Pupils are equal, round, and reactive to light.  Musculoskeletal:        General: Tenderness present. No swelling or signs of injury.  Skin:    General: Skin is warm and dry.     Capillary Refill: Capillary refill takes less than 2 seconds.     Findings: No bruising or erythema.  Neurological:     General: No focal deficit present.     Mental Status: He is alert and oriented to person, place, and time.      UC Treatments / Results  Labs (all labs ordered are listed, but only abnormal results are displayed) Labs Reviewed  BASIC METABOLIC PANEL - Abnormal; Notable for the following components:      Result Value   Glucose, Bld 151 (*)    Creatinine, Ser 1.42 (*)    Calcium 8.7 (*)    GFR, Estimated 52 (*)    All other components within normal limits    EKG   Radiology No results found.  Procedures Procedures (including critical care time)  Medications Ordered in UC Medications  dexamethasone (DECADRON) injection 10 mg (10 mg Intramuscular Given 04/19/23 1732)    Initial Impression / Assessment and Plan / UC Course  I have reviewed the triage vital signs and the nursing notes.  Pertinent labs & imaging  results that were available during my care of the patient were reviewed by me and considered in my medical decision making (see chart for details).   Patient is a nontoxic-appearing 73 year old male presenting for evaluation of musculoskeletal and neurologic complaints as outlined HPI above.  With regards the patient's neurologic complaint he is complaining of tingling to the entire left side of his head that has been present for last 4 weeks.  Occasionally associated with a headache but no changes in vision, numbness, tingling, weakness in any of his extremities.  When asked if he experiences before he states that he has.  He does have a primary care provider but he has never been evaluated for it or talk to his PCP about this.  Patient second complaint is pain in the right arm that is centered around the right elbow.  He has limited flexion and extension of his elbow secondary to pain.  He is keeping it flexed at 90 degrees.  Pronation and supination of his wrist is not impeded.  Radial and ulnar pulses are 2+.  Patient has normal sensation in both hands and 5/5 bilateral grips.  He has pain with palpation of the muscle groups of the proximal forearm as well as the distal upper arm but no bony tenderness with compression of medial or lateral epicondyle, palpation of the antecubital fossa, or with palpation of the olecranon process.  There is no erythema, edema, or ecchymosis noted.  I will obtain a radiograph of the right elbow to rule out any bony abnormality or degenerative change.  I suspect most likely patient  is experiencing an arthritis flare.  With regards to the tingling on the left side of his face I will check for electrolyte abnormality to see if that is causing the paresthesias.  Other differentials include vitamin deficiency, which we cannot check for here in the urgent care, and intracranial process.  Unfortunately, we no longer have the ability to do a CT scan here at urgent care this evening.   Given that he has had symptoms for 4 weeks I do not feel this is an acute issue that requires referral to the emergency department.  If patient's electrolytes are normal I will have him follow-up with his primary care provider.  Right elbow x-ray independently reviewed and evaluated by me.  Impression: Osteophyte present on the tip of the olecranon process as well as significant degenerative changes of the right elbow joint.  No evidence of fracture or dislocation.  No soft tissue swelling present.  Radiology overread is pending. Radiology impression states degenerative changes with associated joint effusion.  No bony abnormality.  The patient's daughter is at the bedside and she reports that the patient was recently at his PCPs office where they took him off of his blood pressure medication.  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  BMP shows normal electrolytes.  Patient's BUN is normal but his creatinine is elevated at 1.42.  This is an interval improvement over BMP from 05/15/2021 that showed creatinine at 1.70.  I will discharge patient home with a diagnosis of osteoarthritis of right elbow.  I will have staff administer 10 mg of IM Decadron in clinic to help with pain relief and have him start Medrol Dosepak tomorrow morning.  Also he may apply moist heat or ice to his elbow for 20 minutes at a time, 2-3 times a day, as needed for pain.   Final Clinical Impressions(s) / UC Diagnoses   Final diagnoses:  Right elbow pain  Osteoarthritis of right elbow, unspecified osteoarthritis type     Discharge Instructions      Your x-rays show that you have arthritis in your right elbow.  No evidence of broken bones.  This is most likely what is causing your elbow pain.  We have given you a shot for the pain and inflammation in clinic  that will continue to work for the next 3 days.  I also want you to start taking a short course of low-dose steroids tomorrow morning.  Take the Medrol Dosepak according the package instructions starting tomorrow at breakfast.  Rest your right elbow is much as possible.  You may apply ice, or moist heat, to your right elbow for 20 minutes at a time, 2-3 times a day, to help with pain and inflammation.  You may use whichever modality feels better to you.  If your symptoms do not improve, or they worsen, I recommend that you follow-up with orthopedics as you may need a targeted injection of your elbow.     ED Prescriptions     Medication Sig Dispense Auth. Provider   methylPREDNISolone (MEDROL DOSEPAK) 4 MG TBPK tablet Take according to the package insert. 1 each Becky Augusta, NP      PDMP not reviewed this encounter.   Becky Augusta, NP 04/19/23 1751    Becky Augusta, NP 04/19/23 [phone]

## 2023-04-19 NOTE — ED Triage Notes (Signed)
 Patient states that right arm started hurting about 3 days ago. Tingling on the left side of the face 3-2 weeks.

## 2023-04-19 NOTE — Discharge Instructions (Addendum)
 Your x-rays show that you have arthritis in your right elbow.  No evidence of broken bones.  This is most likely what is causing your elbow pain.  We have given you a shot for the pain and inflammation in clinic that will continue to work for the next 3 days.  I also want you to start taking a short course of low-dose steroids tomorrow morning.  Take the Medrol Dosepak according the package instructions starting tomorrow at breakfast.  Rest your right elbow is much as possible.  You may apply ice, or moist heat, to your right elbow for 20 minutes at a time, 2-3 times a day, to help with pain and inflammation.  You may use whichever modality feels better to you.  If your symptoms do not improve, or they worsen, I recommend that you follow-up with orthopedics as you may need a targeted injection of your elbow.

## 2023-06-29 ENCOUNTER — Ambulatory Visit: Admission: EM | Admit: 2023-06-29 | Discharge: 2023-06-29 | Disposition: A | Discharge status: Home / Self Care

## 2023-06-29 DIAGNOSIS — K121 Other forms of stomatitis: Secondary | ICD-10-CM

## 2023-06-29 MED ORDER — AMOXICILLIN-POT CLAVULANATE 875-125 MG PO TABS: 1.0000 | ORAL_TABLET | Freq: Two times a day (BID) | ORAL | 0 refills | Status: AC

## 2023-06-29 MED ORDER — NYSTATIN 100000 UNIT/ML MT SUSP: 5.0000 mL | Freq: Three times a day (TID) | OROMUCOSAL | 2 refills | Status: AC

## 2023-06-29 NOTE — Discharge Instructions (Addendum)
  1. Oral ulceration (Primary) - amoxicillin-clavulanate (AUGMENTIN) 875-125 MG tablet; Take 1 tablet by mouth every 12 (twelve) hours.  Dispense: 14 tablet; Refill: 0 - magic mouthwash (nystatin, lidocaine , diphenhydrAMINE, alum & mag hydroxide) suspension; Swish and spit 5 mLs 3 (three) times daily.  Dispense: 180 mL; Refill: 2 - Ambulatory referral to Oral Maxillofacial Surgery for follow-up evaluation and oral cancer screening. -Continue to monitor symptoms for any change in severity if there is any escalation of current symptoms or development of new symptoms follow-up in ER for further evaluation and management.

## 2023-06-29 NOTE — ED Triage Notes (Signed)
 Pt c/o sore underneath tongue x1 mon. Has tried mouthwash w/o relief.

## 2023-06-29 NOTE — ED Provider Notes (Signed)
 [facility]  Note:  This document was prepared using Conservation officer, historic buildings and may include unintentional dictation errors.  MRN: [id number] DOB: Jun 22, 1950  Subjective:   Howard Lucas is a 73 y.o. male presenting for oral ulcer under his tongue x 1 month.  Patient reports he has this ulcer pop up recurrently but states this time it is much bigger and more painful than usual.  Patient states he is tried using mouthwash to clean the area but is still having increased pain and burning.  Patient has not tried any other over-the-counter medication or oral anesthetics.  Patient denies any injury or burn inside his mouth.  Patient saw his dentist who told him they could not help him.  Patient is requesting a referral for someone to do an oral cancer screening.  No current facility-administered medications for this encounter.  Current Outpatient Medications:    albuterol (PROVENTIL HFA;VENTOLIN HFA) 108 (90 Base) MCG/ACT inhaler, Inhale into the lungs every 6 (six) hours as needed for wheezing or shortness of breath., Disp: , Rfl:    amoxicillin-clavulanate (AUGMENTIN) 875-125 MG tablet, Take 1 tablet by mouth every 12 (twelve) hours., Disp: 14 tablet, Rfl: 0   aspirin 81 MG chewable tablet, Chew 81 mg as needed by mouth., Disp: , Rfl:    atorvastatin (LIPITOR) 80 MG tablet, Take 80 mg daily by mouth., Disp: , Rfl:    budesonide-formoterol (SYMBICORT) 160-4.5 MCG/ACT inhaler, Inhale 2 puffs into the lungs 2 (two) times daily., Disp: , Rfl:    DULoxetine (CYMBALTA) 60 MG capsule, Take 60 mg by mouth daily., Disp: , Rfl:    gabapentin (NEURONTIN) 100 MG capsule, Take 100 mg by mouth 3 (three) times daily., Disp: , Rfl:    hydrochlorothiazide (MICROZIDE) 12.5 MG capsule, Take 12.5 mg by mouth daily., Disp: , Rfl:    hydrocortisone  cream 0.5 %, Apply 1 application. topically as needed for itching (scalp itching)., Disp: , Rfl:    lisinopril (PRINIVIL,ZESTRIL) 20 MG tablet, Take 20  mg daily by mouth., Disp: , Rfl:    magic mouthwash (nystatin, lidocaine , diphenhydrAMINE, alum & mag hydroxide) suspension, Swish and spit 5 mLs 3 (three) times daily., Disp: 180 mL, Rfl: 2   nitroGLYCERIN  (NITROSTAT ) 0.4 MG SL tablet, Place 1 tablet (0.4 mg total) every 5 (five) minutes as needed under the tongue for chest pain., Disp: 35 tablet, Rfl: 3   omeprazole (PRILOSEC) 40 MG capsule, Take 40 mg by mouth 2 (two) times daily., Disp: , Rfl:    senna (SENOKOT) 8.6 MG TABS tablet, Take 2 tablets by mouth daily., Disp: , Rfl:    No Known Allergies  Past Medical History:  Diagnosis Date   Alcohol abuse    Fatigue    GERD (gastroesophageal reflux disease)    Heart murmur    Hyperlipidemia    Hypertension    Knee pain, left    Memory impairment    Sleeping difficulties      Past Surgical History:  Procedure Laterality Date   BACK SURGERY     COLONOSCOPY     COLONOSCOPY WITH PROPOFOL  N/A 09/14/2019   Procedure: COLONOSCOPY WITH PROPOFOL ;  Surgeon: Marnee Sink, MD;  Location: [facility];  Service: Endoscopy;  Laterality: N/A;   KNEE SURGERY Bilateral     Family History  Problem Relation Age of Onset   Other Mother        "blood clot in the heart"   Diabetes Father    Cancer Sister  Cancer Brother     Social History   Tobacco Use   Smoking status: Every Day    Current packs/day: 2.00    Average packs/day: 2.0 packs/day for 51.0 years (102.0 ttl pk-yrs)    Types: Cigarettes   Smokeless tobacco: Never  Vaping Use   Vaping status: Never Used  Substance Use Topics   Alcohol use: Not Currently    Alcohol/week: 70.0 standard drinks of alcohol    Types: 70 Cans of beer per week    Comment: 8/9 - pt reports he has cut back to about 6 beers on Tues and Fri)   Drug use: No    ROS Refer to HPI for ROS details.  Objective:   Vitals: BP (!) 147/75 (BP Location: Right Arm)   Pulse 79   Temp 99.3 F (37.4 C) (Oral)   Resp 16   Ht 5\' 8"  (1.727 m)   Wt 173 lb  (78.5 kg)   SpO2 95%   BMI 26.30 kg/m   Physical Exam Vitals and nursing note reviewed.  Constitutional:      General: He is not in acute distress.    Appearance: Normal appearance. He is well-developed. He is not ill-appearing or toxic-appearing.  HENT:     Head: Normocephalic.     Mouth/Throat:     Mouth: Mucous membranes are moist.     Palate: Lesions (Approximately 1.2 cm oral ulcer underneath tongue on the left-hand side, mild erythema, mild swelling, pain with palpation, no secondary lesions or dental abnormalities noted.) present.  Cardiovascular:     Rate and Rhythm: Normal rate.  Pulmonary:     Effort: Pulmonary effort is normal. No respiratory distress.  Skin:    General: Skin is warm and dry.  Neurological:     General: No focal deficit present.     Mental Status: He is alert and oriented to person, place, and time.  Psychiatric:        Mood and Affect: Mood normal.        Behavior: Behavior normal.     Procedures  No results found for this or any previous visit (from the past 24 hours).  Assessment and Plan :     Discharge Instructions       1. Oral ulceration (Primary) - amoxicillin-clavulanate (AUGMENTIN) 875-125 MG tablet; Take 1 tablet by mouth every 12 (twelve) hours.  Dispense: 14 tablet; Refill: 0 - magic mouthwash (nystatin, lidocaine , diphenhydrAMINE, alum & mag hydroxide) suspension; Swish and spit 5 mLs 3 (three) times daily.  Dispense: 180 mL; Refill: 2 - Ambulatory referral to Oral Maxillofacial Surgery for follow-up evaluation and oral cancer screening. -Continue to monitor symptoms for any change in severity if there is any escalation of current symptoms or development of new symptoms follow-up in ER for further evaluation and management.    Judea Riches B [place]   Khylin Gutridge, [place], [place] 06/29/23 1706
